# Patient Record
Sex: Female | Born: 2000 | Race: Black or African American | Hispanic: No | Marital: Single | Smoking: Never smoker
Health system: Southern US, Community
[De-identification: ages and names within clinical notes are randomized; demographics above are authoritative.]

## PROBLEM LIST (undated history)

## (undated) DIAGNOSIS — J45909 Unspecified asthma, uncomplicated: Secondary | ICD-10-CM

---
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
## 2021-03-23 ENCOUNTER — Emergency Department (HOSPITAL_COMMUNITY)
Admission: EM | Admit: 2021-03-23 | Discharge: 2021-03-23 | Disposition: A | Discharge status: Home / Self Care | Payer: Self-pay | Attending: Emergency Medicine | Admitting: Emergency Medicine

## 2021-03-23 ENCOUNTER — Other Ambulatory Visit: Payer: Self-pay

## 2021-03-23 ENCOUNTER — Emergency Department (HOSPITAL_COMMUNITY): Payer: Self-pay

## 2021-03-23 DIAGNOSIS — S99921A Unspecified injury of right foot, initial encounter: Secondary | ICD-10-CM | POA: Insufficient documentation

## 2021-03-23 DIAGNOSIS — X58XXXA Exposure to other specified factors, initial encounter: Secondary | ICD-10-CM | POA: Insufficient documentation

## 2021-03-23 NOTE — ED Triage Notes (Signed)
Stated potentially injured right toe a few days ago while she was having a panic attack

## 2021-03-23 NOTE — ED Notes (Signed)
DC instructions reviewed with pt. PT verbalized understanding. PT DC °

## 2021-03-23 NOTE — Discharge Instructions (Signed)
X-ray is normal. Tylenol or motrin for pain. Can follow-up with your primary care doctor. Return for new concerns.

## 2021-03-23 NOTE — ED Provider Notes (Signed)
[facility] Provider Note   CSN: [id number] Arrival date & time: 03/23/21  1818     History Chief Complaint  Patient presents with  . Toe Pain    Deborah Holland is a 20 y.o. female.  The history is provided by the patient and medical records.  Toe Pain    19 y.o. F here with right great toe pain.  States she injured her toe last week while she was having a panic attack.  Initially had swelling but did soak in epsom salts with relief of that.  Denies any pain related to the nail itself.  No fever/chills.  She is ambulatory.  Of note, patient generally uncooperative during history and physical as she is on phone with friend and refusing to hang up.  No past medical history on file.  There are no problems to display for this patient.   No past surgical history on file.   OB History   No obstetric history on file.     No family history on file.  Social History   Tobacco Use  . Smoking status: Never Smoker  . Smokeless tobacco: Never Used  Substance Use Topics  . Alcohol use: No    Home Medications Prior to Admission medications   Medication Sig Start Date End Date Taking? Authorizing Provider  amoxicillin (AMOXIL) 875 MG tablet Take 1 tablet (875 mg total) by mouth 2 (two) times daily. 07/09/15   Viviano Simas, NP  benzonatate (TESSALON) 100 MG capsule Take 1 capsule (100 mg total) by mouth every 8 (eight) hours. 09/23/16   Sam, Ace Gins, PA-C  fluticasone (FLONASE) 50 MCG/ACT nasal spray Place 2 sprays into both nostrils daily. 09/23/16   Sam, Ace Gins, PA-C  ibuprofen (ADVIL,MOTRIN) 600 MG tablet Take 1 tablet (600 mg total) by mouth every 6 (six) hours as needed. 09/23/16   Sam, Ace Gins, PA-C  methocarbamol (ROBAXIN) 500 MG tablet Take 1 tablet (500 mg total) by mouth 2 (two) times daily. 11/15/17   Mabe, Latanya Maudlin, MD  naproxen (NAPROSYN) 500 MG tablet Take 1 tablet (500 mg total) by mouth 2 (two) times daily. 11/15/17    Mabe, Latanya Maudlin, MD    Allergies    Other  Review of Systems   Review of Systems  Musculoskeletal: Positive for arthralgias.  All other systems reviewed and are negative.   Physical Exam Updated Vital Signs BP 127/79 (BP Location: Right Arm)   Pulse (!) 56   Temp 98.6 F (37 C) (Oral)   Resp 12   SpO2 100%   Physical Exam Vitals and nursing note reviewed.  Constitutional:      Appearance: She is well-developed.  HENT:     Head: Normocephalic and atraumatic.  Eyes:     Conjunctiva/sclera: Conjunctivae normal.     Pupils: Pupils are equal, round, and reactive to light.  Cardiovascular:     Rate and Rhythm: Normal rate and regular rhythm.     Heart sounds: Normal heart sounds.  Pulmonary:     Effort: Pulmonary effort is normal.     Breath sounds: Normal breath sounds.  Abdominal:     General: Bowel sounds are normal.     Palpations: Abdomen is soft.  Musculoskeletal:        General: Normal range of motion.     Cervical back: Normal range of motion.       Feet:     Comments: Tenderness as depicted without deformity or swelling, nails  are overgrown but intact, no signs of infection present, ambulatory  Skin:    General: Skin is warm and dry.  Neurological:     Mental Status: She is alert and oriented to person, place, and time.     ED Results / Procedures / Treatments   Labs (all labs ordered are listed, but only abnormal results are displayed) Labs Reviewed - No data to display  EKG None  Radiology DG Toe Great Right  Result Date: 03/23/2021 CLINICAL DATA:  Status post trauma. EXAM: RIGHT GREAT TOE COMPARISON:  None. FINDINGS: There is no evidence of fracture or dislocation. There is no evidence of arthropathy or other focal bone abnormality. Soft tissues are unremarkable. IMPRESSION: Negative. Electronically Signed   By: Aram Candela M.D.   On: 03/23/2021 22:46    Procedures Procedures   Medications Ordered in ED Medications - No data to  display  ED Course  I have reviewed the triage vital signs and the nursing notes.  Pertinent labs & imaging results that were available during my care of the patient were reviewed by me and considered in my medical decision making (see chart for details).    MDM Rules/Calculators/A&P  20 year old female presenting to the ED with right great toe pain, reportedly she injured it last week during a panic attack.  There is no deformity on exam.  No signs of infection.  X-ray is negative.  Discharged home with symptomatic care and PCP follow-up.  May return here for new concerns.  Final Clinical Impression(s) / ED Diagnoses Final diagnoses:  Injury of toe on right foot, initial encounter    Rx / DC Orders ED Discharge Orders    None       Deborah Hatchet, PA-C 03/23/21 2333    Deborah Laine, MD 03/25/21 [phone]

## 2021-03-23 NOTE — ED Triage Notes (Signed)
Emergency Medicine Provider Triage Evaluation Note  Deborah [facility] , Holland 20 y.o. female  was evaluated in triage.  Pt complains of right great toe pain.  She thinks she injured this Holland few nights ago when she is having Holland panic attack.  She has pain to her metacarpal, on her right great toe.  She feels like she has difficulty with flexion.  She states initially was swollen however soaking this and it has resolved.  She denies any pain to her surrounding nailbed. No Paresthesias, weakness.  She is able to ambulate.  No history of gout.  No fever, chills  Review of Systems  Positive: Right great toe pain Negative: Foot pain, redness, swelling, warmth  Physical Exam  BP 127/79 (BP Location: Right Arm)   Pulse (!) 56   Temp 98.6 F (37 C) (Oral)   Resp 12   SpO2 100%  Gen:   Awake, no distress   HEENT:  Atraumatic  Resp:  Normal effort  Cardiac:  Normal rate  Abd:   Nondistended, nontender  MSK:   Moves extremities without difficulty.  Diffuse tenderness to right great toe from metacarpal >>distally.  She does have very long toenail however no evidence of ingrown toenail.  No surrounding erythema or warmth Neuro:  Speech clear   Medical Decision Making  Medically screening exam initiated at 6:53 PM.  Appropriate orders placed.  Deborah Holland was informed that the remainder of the evaluation will be completed by another provider, this initial triage assessment does not replace that evaluation, and the importance of remaining in the ED until their evaluation is complete.  Clinical Impression  Right great toe pain   Deborah Co A, PA-C 03/23/21 1855

## 2021-09-07 ENCOUNTER — Other Ambulatory Visit (HOSPITAL_COMMUNITY): Payer: Self-pay

## 2022-03-11 ENCOUNTER — Encounter (HOSPITAL_COMMUNITY): Payer: Self-pay | Admitting: Emergency Medicine

## 2022-03-11 ENCOUNTER — Emergency Department (HOSPITAL_COMMUNITY)
Admission: EM | Admit: 2022-03-11 | Discharge: 2022-03-11 | Disposition: A | Discharge status: Home / Self Care | Payer: Self-pay | Attending: Emergency Medicine | Admitting: Emergency Medicine

## 2022-03-11 DIAGNOSIS — R42 Dizziness and giddiness: Secondary | ICD-10-CM | POA: Insufficient documentation

## 2022-03-11 DIAGNOSIS — F1290 Cannabis use, unspecified, uncomplicated: Secondary | ICD-10-CM | POA: Insufficient documentation

## 2022-03-11 NOTE — ED Provider Notes (Signed)
?[facility]-EMERGENCY DEPT ?Provider Note ? ? ?CSN: [id number] ?Arrival date & time: 03/11/22  1735 ? ?  ? ?History ? ?Chief Complaint  ?Patient presents with  ? Dizziness  ? ? ?Deborah Holland is a 21 y.o. female. ? ?21 year old female presents with dizziness times several days.  Patient does use copious amounts of marijuana daily.  Denies any associated chest pain or chest pressure.  Her dizziness is similar to her chronic anxiety.  She has been prescribed medication for this but she has been noncompliant.  Denies any SI or HI.  No fever or chills.  No associated headache.  No treatment use prior to arrival ? ? ?  ? ?Home Medications ?Prior to Admission medications   ?Medication Sig Start Date End Date Taking? Authorizing Provider  ?amoxicillin (AMOXIL) 875 MG tablet Take 1 tablet (875 mg total) by mouth 2 (two) times daily. 07/09/15   Viviano Simas, NP  ?benzonatate (TESSALON) 100 MG capsule Take 1 capsule (100 mg total) by mouth every 8 (eight) hours. 09/23/16   Eliseo Squires, PA-C  ?fluticasone (FLONASE) 50 MCG/ACT nasal spray Place 2 sprays into both nostrils daily. 09/23/16   Eliseo Squires, PA-C  ?ibuprofen (ADVIL,MOTRIN) 600 MG tablet Take 1 tablet (600 mg total) by mouth every 6 (six) hours as needed. 09/23/16   Eliseo Squires, PA-C  ?methocarbamol (ROBAXIN) 500 MG tablet Take 1 tablet (500 mg total) by mouth 2 (two) times daily. 11/15/17   Mabe, Latanya Maudlin, MD  ?naproxen (NAPROSYN) 500 MG tablet Take 1 tablet (500 mg total) by mouth 2 (two) times daily. 11/15/17   MabeLatanya Maudlin, MD  ?   ? ?Allergies    ?Other   ? ?Review of Systems   ?Review of Systems  ?All other systems reviewed and are negative. ? ?Physical Exam ?Updated Vital Signs ?BP 127/84 (BP Location: Left Arm)   Pulse 66   Temp 98.7 ?F (37.1 ?C) (Oral)   Resp 18   LMP 02/10/2022   SpO2 100%  ?Physical Exam ?Vitals and nursing note reviewed.  ?Constitutional:   ?   General: She is not in acute distress. ?    Appearance: Normal appearance. She is well-developed. She is not toxic-appearing.  ?HENT:  ?   Head: Normocephalic and atraumatic.  ?Eyes:  ?   General: Lids are normal.  ?   Conjunctiva/sclera: Conjunctivae normal.  ?   Pupils: Pupils are equal, round, and reactive to light.  ?Neck:  ?   Thyroid: No thyroid mass.  ?   Trachea: No tracheal deviation.  ?Cardiovascular:  ?   Rate and Rhythm: Normal rate and regular rhythm.  ?   Heart sounds: Normal heart sounds. No murmur heard. ?  No gallop.  ?Pulmonary:  ?   Effort: Pulmonary effort is normal. No respiratory distress.  ?   Breath sounds: Normal breath sounds. No stridor. No decreased breath sounds, wheezing, rhonchi or rales.  ?Abdominal:  ?   General: There is no distension.  ?   Palpations: Abdomen is soft.  ?   Tenderness: There is no abdominal tenderness. There is no rebound.  ?Musculoskeletal:     ?   General: No tenderness. Normal range of motion.  ?   Cervical back: Normal range of motion and neck supple.  ?Skin: ?   General: Skin is warm and dry.  ?   Findings: No abrasion or rash.  ?Neurological:  ?   Mental Status: She is alert and oriented  to person, place, and time. Mental status is at baseline.  ?   GCS: GCS eye subscore is 4. GCS verbal subscore is 5. GCS motor subscore is 6.  ?   Cranial Nerves: No cranial nerve deficit.  ?   Sensory: No sensory deficit.  ?   Motor: Motor function is intact.  ?Psychiatric:     ?   Attention and Perception: Attention normal.     ?   Speech: Speech normal.     ?   Behavior: Behavior normal.  ? ? ?ED Results / Procedures / Treatments   ?Labs ?(all labs ordered are listed, but only abnormal results are displayed) ?Labs Reviewed - No data to display ? ?EKG ?EKG Interpretation ? ?Date/Time:  Thursday March 11 2022 18:14:21 EDT ?Ventricular Rate:  61 ?PR Interval:  149 ?QRS Duration: 84 ?QT Interval:  415 ?QTC Calculation: 418 ?R Axis:   73 ?Text Interpretation: Sinus rhythm Confirmed by Lorre Nick ([id number]) on 03/11/2022  6:35:48 PM ? ?Radiology ?No results found. ? ?Procedures ?Procedures  ? ? ?Medications Ordered in ED ?Medications - No data to display ? ?ED Course/ Medical Decision Making/ A&P ?  ?                        ?Medical Decision Making ?Amount and/or Complexity of Data Reviewed ?ECG/medicine tests: ordered. ? ? ?Patient well-appearing here.  EKG per interpretation shows no signs of acute ischemic.  Patient without focal neurological deficits.  Suspect that this could be from her use of marijuana.  Do not feel that she needs to have any blood work at this time.  Case discussed with her mother in the room and patient will be discharged.   ? ? ? ? ? ?Final Clinical Impression(s) / ED Diagnoses ?Final diagnoses:  ?None  ? ? ?Rx / DC Orders ?ED Discharge Orders   ? ? None  ? ?  ? ? ?  ?Lorre Nick, MD ?03/11/22 1841 ? ?

## 2022-03-11 NOTE — ED Triage Notes (Signed)
Per pt, states she has been feeling dizzy and light headed for a couple of days-no new meds, no recent changes in diet ?

## 2022-04-09 ENCOUNTER — Emergency Department (HOSPITAL_COMMUNITY)
Admission: EM | Admit: 2022-04-09 | Discharge: 2022-04-09 | Disposition: A | Discharge status: Home / Self Care | Payer: Self-pay | Attending: Emergency Medicine | Admitting: Emergency Medicine

## 2022-04-09 ENCOUNTER — Other Ambulatory Visit: Payer: Self-pay

## 2022-04-09 ENCOUNTER — Encounter (HOSPITAL_COMMUNITY): Payer: Self-pay

## 2022-04-09 DIAGNOSIS — Z20822 Contact with and (suspected) exposure to covid-19: Secondary | ICD-10-CM | POA: Insufficient documentation

## 2022-04-09 DIAGNOSIS — J069 Acute upper respiratory infection, unspecified: Secondary | ICD-10-CM | POA: Insufficient documentation

## 2022-04-09 DIAGNOSIS — R42 Dizziness and giddiness: Secondary | ICD-10-CM | POA: Insufficient documentation

## 2022-04-09 LAB — RESP PANEL BY RT-PCR (FLU A&B, COVID) ARPGX2
Influenza A by PCR: NEGATIVE
Influenza B by PCR: NEGATIVE
SARS Coronavirus 2 by RT PCR: NEGATIVE

## 2022-04-09 MED ORDER — BENZONATATE 100 MG PO CAPS: 100.0000 mg | ORAL_CAPSULE | Freq: Three times a day (TID) | ORAL | 0 refills | Status: DC

## 2022-04-09 MED ORDER — ALBUTEROL SULFATE HFA 108 (90 BASE) MCG/ACT IN AERS: 1.0000 | INHALATION_SPRAY | Freq: Four times a day (QID) | RESPIRATORY_TRACT | 0 refills | Status: DC | PRN

## 2022-04-09 NOTE — ED Provider Notes (Signed)
?[facility]-EMERGENCY DEPT ?Provider Note ? ? ?CSN: [id number] ?Arrival date & time: 04/09/22  1701 ? ?  ? ?History ? ?Chief Complaint  ?Patient presents with  ? Shortness of Breath  ? Dizziness  ? Nasal Congestion  ? Cough  ? ? ?Deborah Holland is a 21 y.o. female. ? ?Patient with no pertinent past medical history presents today with complaints of cough and congestion. She states that same has been ongoing for the past 5 days. She states that her roommate has been sick with similar symptoms. She states that she has been trying Thera-flu and tylenol/ibuprofen for management of her symptoms but she is still having a non-productive cough that is not clearing up. She states that she has not been eating or drinking as well since onset of her symptoms due to feeling poorly. She denies any fevers, chills, nausea or vomiting. Does endorse slight headache and dizziness that has been intermittent today. ? ?The history is provided by the patient. No language interpreter was used.  ?Shortness of Breath ?Associated symptoms: cough   ?Associated symptoms: no fever and no sore throat   ?Dizziness ?Cough ?Associated symptoms: no chills, no fever and no sore throat   ? ?  ? ?Home Medications ?Prior to Admission medications   ?Medication Sig Start Date End Date Taking? Authorizing Provider  ?amoxicillin (AMOXIL) 875 MG tablet Take 1 tablet (875 mg total) by mouth 2 (two) times daily. 07/09/15   Viviano Simas, NP  ?benzonatate (TESSALON) 100 MG capsule Take 1 capsule (100 mg total) by mouth every 8 (eight) hours. 09/23/16   Eliseo Squires, PA-C  ?fluticasone (FLONASE) 50 MCG/ACT nasal spray Place 2 sprays into both nostrils daily. 09/23/16   Eliseo Squires, PA-C  ?ibuprofen (ADVIL,MOTRIN) 600 MG tablet Take 1 tablet (600 mg total) by mouth every 6 (six) hours as needed. 09/23/16   Eliseo Squires, PA-C  ?methocarbamol (ROBAXIN) 500 MG tablet Take 1 tablet (500 mg total) by mouth 2 (two) times daily.  11/15/17   Mabe, Latanya Maudlin, MD  ?naproxen (NAPROSYN) 500 MG tablet Take 1 tablet (500 mg total) by mouth 2 (two) times daily. 11/15/17   MabeLatanya Maudlin, MD  ?   ? ?Allergies    ?Other   ? ?Review of Systems   ?Review of Systems  ?Constitutional:  Negative for chills and fever.  ?HENT:  Positive for congestion. Negative for sore throat, trouble swallowing and voice change.   ?Respiratory:  Positive for cough.   ?Neurological:  Positive for dizziness.  ?All other systems reviewed and are negative. ? ?Physical Exam ?Updated Vital Signs ?BP 118/60 (BP Location: Left Arm)   Pulse 87   Temp 98.4 ?F (36.9 ?C) (Oral)   Resp 16   Ht 5\' 3"  (1.6 m)   Wt 51.3 kg   LMP 03/13/2022   SpO2 98%   BMI 20.05 kg/m?  ?Physical Exam ?Vitals and nursing note reviewed.  ?Constitutional:   ?   General: She is not in acute distress. ?   Appearance: Normal appearance. She is well-developed and normal weight. She is not ill-appearing, toxic-appearing or diaphoretic.  ?HENT:  ?   Head: Normocephalic and atraumatic.  ?Eyes:  ?   Extraocular Movements: Extraocular movements intact.  ?   Pupils: Pupils are equal, round, and reactive to light.  ?Cardiovascular:  ?   Rate and Rhythm: Normal rate and regular rhythm.  ?   Heart sounds: Normal heart sounds.  ?Pulmonary:  ?  Effort: Pulmonary effort is normal. No respiratory distress.  ?   Breath sounds: Normal breath sounds.  ?Abdominal:  ?   Palpations: Abdomen is soft.  ?Musculoskeletal:     ?   General: Normal range of motion.  ?   Cervical back: Normal range of motion and neck supple.  ?   Right lower leg: No tenderness. No edema.  ?   Left lower leg: No tenderness. No edema.  ?Lymphadenopathy:  ?   Cervical: No cervical adenopathy.  ?Skin: ?   General: Skin is warm and dry.  ?   Capillary Refill: Capillary refill takes less than 2 seconds.  ?Neurological:  ?   General: No focal deficit present.  ?   Mental Status: She is alert.  ?   Cranial Nerves: No cranial nerve deficit.  ?   Motor: No  weakness.  ?Psychiatric:     ?   Mood and Affect: Mood normal.     ?   Behavior: Behavior normal.  ? ? ?ED Results / Procedures / Treatments   ?Labs ?(all labs ordered are listed, but only abnormal results are displayed) ?Labs Reviewed  ?RESP PANEL BY RT-PCR (FLU A&B, COVID) [id number]  ? ? ?EKG ?None ? ?Radiology ?No results found. ? ?Procedures ?Procedures  ? ? ?Medications Ordered in ED ?Medications - No data to display ? ?ED Course/ Medical Decision Making/ A&P ?  ?                        ?Medical Decision Making ? ?Patient presents today with complaint of cough and congestion. She is afebrile, non-toxic appearing, and in no acute distress with reassuring vital signs. She is also alert and oriented and neurologically intact. Lung sounds present and clear to auscultation in all fields, no imaging indicated at this time. Patients symptoms are consistent with URI, likely viral etiology. Suspect that patients headache and dizziness is related to her URI, discussed with patient who is in agreement. Emphasized the importance of maintaining oral hydration. Discussed that antibiotics are not indicated for viral infections. Pt will be discharged with symptomatic treatment including Tessalon.  Verbalizes understanding and is agreeable with plan. Pt is hemodynamically stable & in NAD prior to dc. Discharged in stable condition. ? ? ?Final Clinical Impression(s) / ED Diagnoses ?Final diagnoses:  ?Viral URI with cough  ? ? ?Rx / DC Orders ?ED Discharge Orders   ? ?      Ordered  ?  albuterol (VENTOLIN HFA) 108 (90 Base) MCG/ACT inhaler  Every 6 hours PRN       ? 04/09/22 1919  ?  benzonatate (TESSALON) 100 MG capsule  Every 8 hours       ? 04/09/22 1919  ? ?  ?  ? ?  ?An After Visit Summary was printed and given to the patient. ? ? ?  ?Bud Face, PA-C ?04/09/22 1922 ? ?  ?Fredia Sorrow, MD ?04/13/22 [phone] ? ?

## 2022-04-09 NOTE — Discharge Instructions (Signed)
As we discussed, your work-up in the ER was reassuring for acute abnormalities.  I suspect that your symptoms are related to an upper respiratory infection.  As these are almost always viral in nature, no antibiotics are indicated.  Please manage at home with the prescribed medications that I have given for you.  You may also take Tylenol/ibuprofen as needed for additional pain and fever control.  ? ?Return if development of any new or worsening symptoms. ?

## 2022-04-09 NOTE — ED Triage Notes (Signed)
Patient c/o nasal congestion, a non productive cough 5 days ago. ?Patient states today, she began having a headache and dizziness, and needing to "use a humidifier  to breathe." ?

## 2022-04-19 ENCOUNTER — Encounter (HOSPITAL_COMMUNITY): Payer: Self-pay | Admitting: Emergency Medicine

## 2022-04-19 ENCOUNTER — Other Ambulatory Visit: Payer: Self-pay

## 2022-04-19 ENCOUNTER — Emergency Department (HOSPITAL_COMMUNITY): Payer: Self-pay

## 2022-04-19 ENCOUNTER — Emergency Department (HOSPITAL_COMMUNITY)
Admission: EM | Admit: 2022-04-19 | Discharge: 2022-04-19 | Disposition: A | Discharge status: Home / Self Care | Payer: Self-pay | Attending: Emergency Medicine | Admitting: Emergency Medicine

## 2022-04-19 DIAGNOSIS — M25522 Pain in left elbow: Secondary | ICD-10-CM | POA: Insufficient documentation

## 2022-04-19 MED ORDER — NAPROXEN 500 MG PO TABS: 500.0000 mg | ORAL_TABLET | Freq: Two times a day (BID) | ORAL | 0 refills | Status: AC

## 2022-04-19 MED ORDER — NAPROXEN 500 MG PO TABS
500.0000 mg | ORAL_TABLET | Freq: Once | ORAL | Status: AC
  Administered 2022-04-19: 500 mg via ORAL
  Filled 2022-04-19: qty 1

## 2022-04-19 NOTE — ED Notes (Signed)
I provided reinforced discharge education based off of discharge instructions. Pt acknowledged and understood my education. Pt had no further questions/concerns for provider/myself.  °

## 2022-04-19 NOTE — ED Provider Notes (Signed)
?[facility]-EMERGENCY DEPT ?Provider Note ? ? ?CSN: [id number] ?Arrival date & time: 04/19/22  1738 ? ?  ? ?History ? ?Chief Complaint  ?Patient presents with  ? Arm Pain  ? ? ?Deborah Holland is a 21 y.o. female with complaint of left elbow pain over the last 3 days.  Unknown cause.  Denies known injury, but states she is "clumsy" and could have bumped it and not remembered doing so.  Denies numbness and tingling of the extremity.  Denies fever.  Has never had this happen before.  Recently finished recovering from an upper respiratory infection, for which she took an antibiotic that she was not prescribed.  States the antibiotics were "red pills" and left over from a family member's ear infection.  Appears to be favoring the left elbow, stating it feels a bit stiff. ? ?Accompanied by mother. ? ?The history is provided by the patient and medical records.  ?Arm Pain ? ? ?  ? ?Home Medications ?Prior to Admission medications   ?Medication Sig Start Date End Date Taking? Authorizing Provider  ?naproxen (NAPROSYN) 500 MG tablet Take 1 tablet (500 mg total) by mouth 2 (two) times daily for 7 days. 04/19/22 04/26/22 Yes Cecil Cobbs, PA-C  ?albuterol (VENTOLIN HFA) 108 (90 Base) MCG/ACT inhaler Inhale 1-2 puffs into the lungs every 6 (six) hours as needed for wheezing or shortness of breath. 04/09/22   Smoot, Sarah A, PA-C  ?amoxicillin (AMOXIL) 875 MG tablet Take 1 tablet (875 mg total) by mouth 2 (two) times daily. 07/09/15   Viviano Simas, NP  ?benzonatate (TESSALON) 100 MG capsule Take 1 capsule (100 mg total) by mouth every 8 (eight) hours. 04/09/22   Smoot, Shawn Route, PA-C  ?fluticasone (FLONASE) 50 MCG/ACT nasal spray Place 2 sprays into both nostrils daily. 09/23/16   Eliseo Squires, PA-C  ?ibuprofen (ADVIL,MOTRIN) 600 MG tablet Take 1 tablet (600 mg total) by mouth every 6 (six) hours as needed. 09/23/16   Eliseo Squires, PA-C  ?methocarbamol (ROBAXIN) 500 MG tablet Take 1 tablet  (500 mg total) by mouth 2 (two) times daily. 11/15/17   MabeLatanya Maudlin, MD  ?   ? ?Allergies    ?Other   ? ?Review of Systems   ?Review of Systems  ?Musculoskeletal:   ?     Left elbow pain  ? ?Physical Exam ?Updated Vital Signs ?BP 108/66 (BP Location: Right Arm)   Pulse 64   Temp 98.4 ?F (36.9 ?C) (Oral)   Resp 18   LMP 04/12/2022   SpO2 100%  ?Physical Exam ?Vitals and nursing note reviewed.  ?Constitutional:   ?   General: She is not in acute distress. ?   Appearance: She is well-developed. She is not ill-appearing or diaphoretic.  ?HENT:  ?   Head: Normocephalic and atraumatic.  ?Eyes:  ?   General: No scleral icterus. ?   Conjunctiva/sclera: Conjunctivae normal.  ?Cardiovascular:  ?   Rate and Rhythm: Normal rate and regular rhythm.  ?   Pulses: Normal pulses.  ?   Heart sounds: No murmur heard. ?Pulmonary:  ?   Effort: Pulmonary effort is normal. No respiratory distress.  ?   Breath sounds: Normal breath sounds.  ?Abdominal:  ?   Palpations: Abdomen is soft.  ?   Tenderness: There is no abdominal tenderness.  ?Musculoskeletal:     ?   General: No swelling.  ?   Right elbow: Normal.  ?     Arms: ? ?  Cervical back: Neck supple. No rigidity or tenderness.  ?   Comments: Tenderness and mild erythema as depicted above, without obvious deformity, swelling, laceration, rash, wound.  Able to fully flex and extend 0-180 degrees with mild encouragement.  Supination and pronation induced mild tenderness.  Range of the shoulder joint preserved.  Without shoulder joint or wrist joint tenderness or impingement.  Radial pulses 2+ bilaterally.  Afebrile.  ?Skin: ?   General: Skin is warm and dry.  ?   Capillary Refill: Capillary refill takes less than 2 seconds.  ?   Coloration: Skin is not jaundiced or pale.  ?Neurological:  ?   Mental Status: She is alert and oriented to person, place, and time.  ?Psychiatric:     ?   Mood and Affect: Mood normal.  ? ? ?ED Results / Procedures / Treatments   ?Labs ?(all labs ordered  are listed, but only abnormal results are displayed) ?Labs Reviewed - No data to display ? ?EKG ?None ? ?Radiology ?DG Elbow Complete Left ? ?Result Date: 04/19/2022 ?CLINICAL DATA:  Left elbow pain EXAM: LEFT ELBOW - COMPLETE 3+ VIEW COMPARISON:  None Available. FINDINGS: There is no evidence of fracture, dislocation, or joint effusion. There is no evidence of arthropathy or other focal bone abnormality. Soft tissues are unremarkable. IMPRESSION: Negative. Electronically Signed   By: Helyn Numbers M.D.   On: 04/19/2022 19:16   ? ?Procedures ?Procedures  ? ? ?Medications Ordered in ED ?Medications  ?naproxen (NAPROSYN) tablet 500 mg (500 mg Oral Given 04/19/22 1931)  ? ? ?ED Course/ Medical Decision Making/ A&P ?  ?                        ?Medical Decision Making ?Amount and/or Complexity of Data Reviewed ?External Data Reviewed: notes. ?Labs: ordered. Decision-making details documented in ED Course. ?Radiology: ordered and independent interpretation performed. Decision-making details documented in ED Course. ?ECG/medicine tests: ordered and independent interpretation performed. Decision-making details documented in ED Course. ? ?Risk ?OTC drugs. ?Prescription drug management. ? ? ?21 y.o. female presents to the ED for concern of Left Elbow Pain ?  ?This involves an extensive number of treatment options, and is a complaint that carries with it a high risk of complications and morbidity.  The emergent differential diagnosis prior to evaluation includes, but is not limited to: Fracture, dislocation, effusion, gout, septic arthritis ? ?This is not an exhaustive differential.  ? ?Past Medical History / Co-morbidities / Social History: ?Recent upper respiratory infection. ? ?Additional History:  ?Internal and external records from outside source obtained and reviewed including ED visits ? ?Physical Exam: ?Physical exam performed. The pertinent findings include: Tenderness and mild erythema of the left elbow, specifically to  the lateral epicondyles.  Shoulder ROM intact. ? ?Lab Tests: ?None ? ?Imaging Studies: ?I ordered imaging studies including x-ray left elbow.  I independently visualized and interpreted said imaging.  Pertinent results include: ?XR left elbow: Negative for fracture, dislocation, or joint effusion ?I agree with the radiologist interpretation. ? ?Medications: ?I ordered medication including naproxen for pain relief.  Reevaluation of the patient after these medicines showed that the patient tolerated this well and showed mild-moderate improvement.  I have reviewed the patients home medicines and have made adjustments as needed ? ?ED Course/Disposition: ?Pt mildly anxious-appearing on exam.  Mild tenderness and favoring of the left arm/elbow.  Left shoulder joint appears unaffected.  No laceration, edema, warmth, erythema, or wound.  Appears to be  a likely mild bruise appreciated on exam of the area of complaint.  Patient is afebrile.  Able to move the elbow joint with almost full range of motion, but appears to be anxious in doing so.  Not suspicious of ischemia, septic arthritis, gout, or reactive arthritis at this time.   Patient X-Ray negative for obvious fracture or dislocation or joint effusion.  Pain managed in ED.  Pt advised to follow up with orthopedics if symptoms persist for possibility of missed fracture diagnosis.  Patient encouraged to stretch and practice range of motion.  Conservative therapy recommended and discussed.  Pt is stable and in NAD.  Plan for discharge home. ? ?After consideration of the diagnostic results and the patient's encounter today, I feel that the emergency department workup does not suggest an emergent condition requiring admission or immediate intervention beyond what has been performed at this time.  The patient is safe for discharge and has been instructed to return immediately for worsening symptoms, change in symptoms or any other concerns.  Discussed course of treatment  thoroughly with the patient and her mother, whom demonstrated understanding.  Patient in agreement and has no further questions. ? ?I discussed this case with my attending physician Dr. Silverio LayYao, who agreed with the

## 2022-04-19 NOTE — ED Triage Notes (Signed)
Patient is complains of limited mobility and pain in the left arm. Pain increases with touch and movement. Symptoms started 3 days ago. She is unsure of what may have cause this.  ?

## 2022-04-19 NOTE — Discharge Instructions (Addendum)
You have been provided the contact information for local orthopedic office.  Please contact and schedule an appointment for reevaluation and continued medical management. ? ?A prescription has been called into her pharmacy by the name of naproxen.  This is an anti-inflammatory which may help relieve pain.  Take 1 tablet every 12 hours as needed for the next week or so.  Always take with water and plenty of food. ? ?Return to the ED for new or worsening symptoms as discussed. ?

## 2022-06-02 ENCOUNTER — Emergency Department (HOSPITAL_COMMUNITY)
Admission: EM | Admit: 2022-06-02 | Discharge: 2022-06-02 | Disposition: A | Discharge status: Home / Self Care | Payer: No Typology Code available for payment source | Attending: Emergency Medicine | Admitting: Emergency Medicine

## 2022-06-02 ENCOUNTER — Emergency Department (HOSPITAL_COMMUNITY): Payer: No Typology Code available for payment source

## 2022-06-02 ENCOUNTER — Encounter (HOSPITAL_COMMUNITY): Payer: Self-pay

## 2022-06-02 DIAGNOSIS — Y92410 Unspecified street and highway as the place of occurrence of the external cause: Secondary | ICD-10-CM | POA: Diagnosis not present

## 2022-06-02 DIAGNOSIS — S161XXA Strain of muscle, fascia and tendon at neck level, initial encounter: Secondary | ICD-10-CM | POA: Diagnosis not present

## 2022-06-02 DIAGNOSIS — S060X0A Concussion without loss of consciousness, initial encounter: Secondary | ICD-10-CM | POA: Insufficient documentation

## 2022-06-02 DIAGNOSIS — M545 Low back pain, unspecified: Secondary | ICD-10-CM | POA: Insufficient documentation

## 2022-06-02 DIAGNOSIS — S0990XA Unspecified injury of head, initial encounter: Secondary | ICD-10-CM | POA: Diagnosis present

## 2022-06-02 MED ORDER — OXYCODONE-ACETAMINOPHEN 5-325 MG PO TABS
1.0000 | ORAL_TABLET | Freq: Once | ORAL | Status: AC
  Administered 2022-06-02: 1 via ORAL
  Filled 2022-06-02: qty 1

## 2022-06-02 MED ORDER — ACETAMINOPHEN 325 MG PO TABS: 650.0000 mg | ORAL_TABLET | Freq: Four times a day (QID) | ORAL | 0 refills | Status: DC | PRN

## 2022-06-02 MED ORDER — IBUPROFEN 600 MG PO TABS: 600.0000 mg | ORAL_TABLET | Freq: Four times a day (QID) | ORAL | 0 refills | Status: DC | PRN

## 2022-06-02 MED ORDER — IBUPROFEN 200 MG PO TABS
600.0000 mg | ORAL_TABLET | Freq: Once | ORAL | Status: AC
  Administered 2022-06-02: 600 mg via ORAL
  Filled 2022-06-02: qty 3

## 2022-06-02 MED ORDER — CYCLOBENZAPRINE HCL 10 MG PO TABS: 10.0000 mg | ORAL_TABLET | Freq: Two times a day (BID) | ORAL | 0 refills | Status: DC | PRN
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
## 2022-06-02 NOTE — ED Notes (Signed)
I provided reinforced discharge education based off of discharge instructions. Pt acknowledged and understood my education. Pt had no further questions/concerns for provider/myself.  °

## 2022-06-25 ENCOUNTER — Emergency Department (HOSPITAL_COMMUNITY): Payer: No Typology Code available for payment source

## 2022-06-25 ENCOUNTER — Emergency Department (HOSPITAL_COMMUNITY)
Admission: EM | Admit: 2022-06-25 | Discharge: 2022-06-25 | Disposition: A | Discharge status: Home / Self Care | Payer: No Typology Code available for payment source | Attending: Emergency Medicine | Admitting: Emergency Medicine

## 2022-06-25 ENCOUNTER — Encounter (HOSPITAL_COMMUNITY): Payer: Self-pay | Admitting: Emergency Medicine

## 2022-06-25 DIAGNOSIS — S39012A Strain of muscle, fascia and tendon of lower back, initial encounter: Secondary | ICD-10-CM | POA: Insufficient documentation

## 2022-06-25 DIAGNOSIS — S3992XA Unspecified injury of lower back, initial encounter: Secondary | ICD-10-CM | POA: Diagnosis present

## 2022-06-25 DIAGNOSIS — Y92410 Unspecified street and highway as the place of occurrence of the external cause: Secondary | ICD-10-CM | POA: Diagnosis not present

## 2022-06-25 DIAGNOSIS — R519 Headache, unspecified: Secondary | ICD-10-CM | POA: Diagnosis not present

## 2022-06-25 DIAGNOSIS — S39012D Strain of muscle, fascia and tendon of lower back, subsequent encounter: Secondary | ICD-10-CM

## 2022-06-25 LAB — POC URINE PREG, ED: Preg Test, Ur: NEGATIVE

## 2022-06-25 MED ORDER — NAPROXEN 500 MG PO TABS: 500.0000 mg | ORAL_TABLET | Freq: Two times a day (BID) | ORAL | 0 refills | Status: DC | PRN

## 2022-06-25 MED ORDER — IBUPROFEN 800 MG PO TABS
800.0000 mg | ORAL_TABLET | Freq: Once | ORAL | Status: AC
Start: 2022-06-25 — End: 2022-06-25
  Administered 2022-06-25: 800 mg via ORAL
  Filled 2022-06-25: qty 1

## 2022-06-25 MED ORDER — METHOCARBAMOL 500 MG PO TABS
500.0000 mg | ORAL_TABLET | Freq: Once | ORAL | Status: AC
  Administered 2022-06-25: 500 mg via ORAL
  Filled 2022-06-25: qty 1

## 2022-06-25 MED ORDER — METHOCARBAMOL 500 MG PO TABS
500.0000 mg | ORAL_TABLET | Freq: Three times a day (TID) | ORAL | 0 refills | Status: DC | PRN
Start: 2022-06-25 — End: 2022-08-22
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
## 2022-06-25 NOTE — Discharge Instructions (Signed)
Take the antiinflammatories as prescribed. Followup with your doctor. Return to the ED if you develop new or worsening symptoms including weakness, numbness, tingling, incontinence, or any other concerns.

## 2022-08-17 ENCOUNTER — Encounter (HOSPITAL_COMMUNITY): Payer: Self-pay

## 2022-08-17 ENCOUNTER — Emergency Department (HOSPITAL_COMMUNITY)
Admission: EM | Admit: 2022-08-17 | Discharge: 2022-08-18 | Disposition: A | Discharge status: Home / Self Care | Payer: Self-pay | Attending: Emergency Medicine | Admitting: Emergency Medicine

## 2022-08-17 DIAGNOSIS — N899 Noninflammatory disorder of vagina, unspecified: Secondary | ICD-10-CM | POA: Insufficient documentation

## 2022-08-17 DIAGNOSIS — E876 Hypokalemia: Secondary | ICD-10-CM | POA: Insufficient documentation

## 2022-08-17 DIAGNOSIS — R824 Acetonuria: Secondary | ICD-10-CM | POA: Insufficient documentation

## 2022-08-17 DIAGNOSIS — D72829 Elevated white blood cell count, unspecified: Secondary | ICD-10-CM | POA: Insufficient documentation

## 2022-08-17 DIAGNOSIS — R1031 Right lower quadrant pain: Secondary | ICD-10-CM | POA: Insufficient documentation

## 2022-08-17 LAB — COMPREHENSIVE METABOLIC PANEL
ALT: 14 U/L (ref 0–44)
AST: 16 U/L (ref 15–41)
Albumin: 4.3 g/dL (ref 3.5–5.0)
Alkaline Phosphatase: 56 U/L (ref 38–126)
Anion gap: 7 (ref 5–15)
BUN: 10 mg/dL (ref 6–20)
CO2: 22 mmol/L (ref 22–32)
Calcium: 9.4 mg/dL (ref 8.9–10.3)
Chloride: 108 mmol/L (ref 98–111)
Creatinine, Ser: 0.71 mg/dL (ref 0.44–1.00)
GFR, Estimated: >60 mL/min (ref >60)
Glucose, Bld: 94 mg/dL (ref 70–99)
Potassium: 3.1 mmol/L — ABNORMAL LOW (ref 3.5–5.1)
Sodium: 137 mmol/L (ref 135–145)
Total Bilirubin: 0.6 mg/dL (ref 0.3–1.2)
Total Protein: 7.7 g/dL (ref 6.5–8.1)

## 2022-08-17 LAB — CBC WITH DIFFERENTIAL/PLATELET
Abs Immature Granulocytes: 0.04 10*3/uL (ref 0.00–0.07)
Basophils Absolute: 0.1 10*3/uL (ref 0.0–0.1)
Basophils Relative: 1 %
Eosinophils Absolute: 0 10*3/uL (ref 0.0–0.5)
Eosinophils Relative: 0 %
HCT: 39.9 % (ref 36.0–46.0)
Hemoglobin: 13.1 g/dL (ref 12.0–15.0)
Immature Granulocytes: 0 %
Lymphocytes Relative: 22 %
Lymphs Abs: 3 10*3/uL (ref 0.7–4.0)
MCH: 28.7 pg (ref 26.0–34.0)
MCHC: 32.8 g/dL (ref 30.0–36.0)
MCV: 87.5 fL (ref 80.0–100.0)
Monocytes Absolute: 0.9 10*3/uL (ref 0.1–1.0)
Monocytes Relative: 7 %
Neutro Abs: 9.5 10*3/uL — ABNORMAL HIGH (ref 1.7–7.7)
Neutrophils Relative %: 70 %
Platelets: 212 10*3/uL (ref 150–400)
RBC: 4.56 MIL/uL (ref 3.87–5.11)
RDW: 13.1 % (ref 11.5–15.5)
WBC: 13.5 10*3/uL — ABNORMAL HIGH (ref 4.0–10.5)
nRBC: 0 % (ref 0.0–0.2)

## 2022-08-17 LAB — URINALYSIS, ROUTINE W REFLEX MICROSCOPIC
Bacteria, UA: NONE SEEN
Bilirubin Urine: NEGATIVE
Glucose, UA: NEGATIVE mg/dL
Hgb urine dipstick: NEGATIVE
Ketones, ur: 20 mg/dL — AB
Nitrite: NEGATIVE
Protein, ur: NEGATIVE mg/dL
Specific Gravity, Urine: 1.026 (ref 1.005–1.030)
pH: 5 (ref 5.0–8.0)

## 2022-08-17 LAB — LIPASE, BLOOD: Lipase: 24 U/L (ref 11–51)

## 2022-08-17 LAB — I-STAT BETA HCG BLOOD, ED (MC, WL, AP ONLY): I-stat hCG, quantitative: <5 m[IU]/mL (ref <5)

## 2022-08-17 NOTE — ED Triage Notes (Signed)
Pt presents with c/o right upper side abdominal pain that started last night. Pt denies any V/D.

## 2022-08-17 NOTE — ED Provider Triage Note (Signed)
Emergency Medicine Provider Triage Evaluation Note  Deborah Holland , a 21 y.o. female  was evaluated in triage.  Pt complains of RLQ abdominal pain associated with nausea and vaginal discharge. Unsure about possibility of STIs. Unsure whether or not she is pregnant.   Review of Systems  Positive: Abdominal pain.  Negative: CP  Physical Exam  BP 121/65 (BP Location: Left Arm)   Pulse 70   Temp 97.9 F (36.6 C) (Oral)   Resp 18   LMP 07/26/2022 (Approximate)   SpO2 100%  Gen:   Awake, no distress   Resp:  Normal effort  MSK:   Moves extremities without difficulty  Other:  TTP on right side of abdomen  Medical Decision Making  Medically screening exam initiated at 7:01 PM.  Appropriate orders placed.  Deborah Holland was informed that the remainder of the evaluation will be completed by another provider, this initial triage assessment does not replace that evaluation, and the importance of remaining in the ED until their evaluation is complete.  labs   Deborah Holland 08/17/22 1902

## 2022-08-17 NOTE — ED Notes (Signed)
Pt to scared to have blood work done. 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

## 2022-08-18 ENCOUNTER — Emergency Department (HOSPITAL_COMMUNITY): Payer: Self-pay

## 2022-08-18 ENCOUNTER — Encounter (HOSPITAL_COMMUNITY): Payer: Self-pay | Admitting: Emergency Medicine

## 2022-08-18 DIAGNOSIS — A749 Chlamydial infection, unspecified: Secondary | ICD-10-CM

## 2022-08-18 HISTORY — DX: Chlamydial infection, unspecified: A74.9

## 2022-08-18 LAB — WET PREP, GENITAL
Clue Cells Wet Prep HPF POC: NONE SEEN
Sperm: NONE SEEN
Trich, Wet Prep: NONE SEEN
WBC, Wet Prep HPF POC: <10 (ref <10)
Yeast Wet Prep HPF POC: NONE SEEN

## 2022-08-18 LAB — GC/CHLAMYDIA PROBE AMP (~~LOC~~) NOT AT ARMC
Chlamydia: POSITIVE — AB
Comment: NEGATIVE
Comment: NORMAL
Neisseria Gonorrhea: NEGATIVE

## 2022-08-18 MED ORDER — IOHEXOL 9 MG/ML PO SOLN: 1000.0000 mL | ORAL | Status: AC

## 2022-08-18 MED ORDER — KETOROLAC TROMETHAMINE 15 MG/ML IJ SOLN
15.0000 mg | Freq: Once | INTRAMUSCULAR | Status: AC
  Administered 2022-08-18: 15 mg via INTRAVENOUS
  Filled 2022-08-18: qty 1

## 2022-08-18 MED ORDER — IOHEXOL 300 MG/ML  SOLN
100.0000 mL | Freq: Once | INTRAMUSCULAR | Status: AC | PRN
Start: 2022-08-18 — End: 2022-08-18
  Administered 2022-08-18: 100 mL via INTRAVENOUS

## 2022-08-18 MED ORDER — OXYCODONE-ACETAMINOPHEN 5-325 MG PO TABS
1.0000 | ORAL_TABLET | Freq: Once | ORAL | Status: AC
  Administered 2022-08-18: 1 via ORAL
  Filled 2022-08-18: qty 1

## 2022-08-18 MED ORDER — SODIUM CHLORIDE (PF) 0.9 % IJ SOLN
INTRAMUSCULAR | Status: AC
  Filled 2022-08-18: qty 50

## 2022-08-18 MED ORDER — IOHEXOL 9 MG/ML PO SOLN
ORAL | Status: AC
  Filled 2022-08-18: qty 1000

## 2022-08-18 MED ORDER — LORAZEPAM 0.5 MG PO TABS
0.5000 mg | ORAL_TABLET | Freq: Once | ORAL | Status: AC
  Administered 2022-08-18: 0.5 mg via ORAL
  Filled 2022-08-18: qty 1

## 2022-08-18 NOTE — ED Notes (Signed)
Patient transported to CT 

## 2022-08-18 NOTE — Discharge Instructions (Signed)
You are seen here today for your belly pain.  You likely had a ruptured ovarian cyst on the right that caused a bit of fluid in your belly that may have caused some inflammation.  You do not have appendicitis and you do not have any signs of infection in your abdomen or pelvis at this time.  Her gonorrhea and Chlamydia tests are pending you may follow your test results in your MyChart account in the next 72 hours.  If anything is positive please return to the ER or urgent care for treatment.  You may take Tylenol ibuprofen as needed for your pain. return to the ER with any severe symptoms

## 2022-08-18 NOTE — ED Provider Notes (Signed)
[facility]-EMERGENCY DEPT Provider Note   CSN: [id number] Arrival date & time: 08/17/22  1834     History  Chief Complaint  Patient presents with   Abdominal Pain    Deborah Holland is a 21 y.o. female who presents with concern for right sided abdominal pain that woke her from her sleep yesterday.  Lower in her abdomen, radiates to the upper right quadrant as well.  Patient states she has had 1 week of vaginal discharge.  She is sexually active with female partners without using protection and is not currently on any contraception.  LMP approximately 3 weeks ago.  No history of STI to the patient's knowledge.  No vomiting or diarrhea, some nausea.  No fevers or chills.  Personally reviewed the patient's medical records.  She does not carry medical diagnoses nor she any medications daily.  She states that she was seen by gynecologist at one point but was unable to tolerate pelvic exam, states that she has PTSD in relation to these evaluations.  HPI     Home Medications Prior to Admission medications   Medication Sig Start Date End Date Taking? Authorizing Provider  acetaminophen (TYLENOL) 325 MG tablet Take 2 tablets (650 mg total) by mouth every 6 (six) hours as needed for up to 30 doses for moderate pain, fever or headache. 06/02/22   Renaye Rakers, Kermit Balo, MD  albuterol (VENTOLIN HFA) 108 (90 Base) MCG/ACT inhaler Inhale 1-2 puffs into the lungs every 6 (six) hours as needed for wheezing or shortness of breath. 04/09/22   Smoot, Shawn Route, PA-C  amoxicillin (AMOXIL) 875 MG tablet Take 1 tablet (875 mg total) by mouth 2 (two) times daily. 07/09/15   Viviano Simas, NP  benzonatate (TESSALON) 100 MG capsule Take 1 capsule (100 mg total) by mouth every 8 (eight) hours. 04/09/22   Smoot, Shawn Route, PA-C  cyclobenzaprine (FLEXERIL) 10 MG tablet Take 1 tablet (10 mg total) by mouth 2 (two) times daily as needed for up to 14 doses for muscle spasms. 06/02/22   Terald Sleeper,  MD  fluticasone (FLONASE) 50 MCG/ACT nasal spray Place 2 sprays into both nostrils daily. 09/23/16   Eliseo Squires, PA-C  ibuprofen (ADVIL) 600 MG tablet Take 1 tablet (600 mg total) by mouth every 6 (six) hours as needed for up to 30 doses for mild pain, moderate pain or headache. 06/02/22   Trifan, Kermit Balo, MD  ibuprofen (ADVIL,MOTRIN) 600 MG tablet Take 1 tablet (600 mg total) by mouth every 6 (six) hours as needed. 09/23/16   Eliseo Squires, PA-C  methocarbamol (ROBAXIN) 500 MG tablet Take 1 tablet (500 mg total) by mouth every 8 (eight) hours as needed for muscle spasms. 06/25/22   Rancour, Jeannett Senior, MD  naproxen (NAPROSYN) 500 MG tablet Take 1 tablet (500 mg total) by mouth 2 (two) times daily as needed. 06/25/22   Glynn Octave, MD      Allergies    Other    Review of Systems   Review of Systems  Constitutional:  Positive for appetite change. Negative for chills, fatigue and fever.  HENT: Negative.    Respiratory: Negative.    Cardiovascular: Negative.   Gastrointestinal:  Positive for abdominal pain and nausea. Negative for diarrhea and vomiting.  Genitourinary:  Positive for pelvic pain and vaginal discharge. Negative for dysuria, frequency, urgency, vaginal bleeding and vaginal pain.  Musculoskeletal: Negative.   Skin: Negative.   Hematological: Negative.   Psychiatric/Behavioral: Negative.  Physical Exam Updated Vital Signs BP (!) 111/59   Pulse 65   Temp 98.4 F (36.9 C)   Resp 15   LMP 07/26/2022 (Approximate) Comment: negative beta HCG 08/17/22  SpO2 100%  Physical Exam Vitals and nursing note reviewed. Exam conducted with a chaperone present Software engineer).  Constitutional:      Appearance: She is not ill-appearing or toxic-appearing.  HENT:     Head: Normocephalic and atraumatic.     Mouth/Throat:     Mouth: Mucous membranes are moist.     Pharynx: No oropharyngeal exudate or posterior oropharyngeal erythema.  Eyes:     General:        Right  eye: No discharge.        Left eye: No discharge.     Extraocular Movements: Extraocular movements intact.     Conjunctiva/sclera: Conjunctivae normal.     Pupils: Pupils are equal, round, and reactive to light.  Cardiovascular:     Rate and Rhythm: Normal rate and regular rhythm.     Pulses: Normal pulses.  Pulmonary:     Effort: Pulmonary effort is normal. No respiratory distress.     Breath sounds: Normal breath sounds. No wheezing or rales.  Abdominal:     General: Bowel sounds are normal. There is no distension.     Palpations: Abdomen is soft.     Tenderness: There is abdominal tenderness in the right lower quadrant. There is no right CVA tenderness, left CVA tenderness, guarding or rebound. Positive signs include McBurney's sign, psoas sign and obturator sign. Negative signs include Murphy's sign and Rovsing's sign.  Genitourinary:    General: Normal vulva.     Exam position: Supine.       Comments: Patient refused internal exam. Musculoskeletal:        General: No deformity.     Cervical back: Neck supple.  Skin:    General: Skin is warm and dry.     Capillary Refill: Capillary refill takes less than 2 seconds.  Neurological:     General: No focal deficit present.     Mental Status: She is alert and oriented to person, place, and time. Mental status is at baseline.  Psychiatric:        Mood and Affect: Mood normal.     ED Results / Procedures / Treatments   Labs (all labs ordered are listed, but only abnormal results are displayed) Labs Reviewed  CBC WITH DIFFERENTIAL/PLATELET - Abnormal; Notable for the following components:      Result Value   WBC 13.5 (*)    Neutro Abs 9.5 (*)    All other components within normal limits  COMPREHENSIVE METABOLIC PANEL - Abnormal; Notable for the following components:   Potassium 3.1 (*)    All other components within normal limits  URINALYSIS, ROUTINE W REFLEX MICROSCOPIC - Abnormal; Notable for the following components:    Ketones, ur 20 (*)    Leukocytes,Ua LARGE (*)    All other components within normal limits  WET PREP, GENITAL  LIPASE, BLOOD  I-STAT BETA HCG BLOOD, ED ([facility], [facility], [facility] ONLY)  GC/CHLAMYDIA PROBE AMP ([place]) NOT AT [facility]    EKG None  Radiology CT ABDOMEN PELVIS W CONTRAST  Result Date: 08/18/2022 CLINICAL DATA:  Right lower quadrant pain for 1 day EXAM: CT ABDOMEN AND PELVIS WITH CONTRAST TECHNIQUE: Multidetector CT imaging of the abdomen and pelvis was performed using the standard protocol following bolus administration of intravenous contrast. RADIATION DOSE REDUCTION: This  exam was performed according to the departmental dose-optimization program which includes automated exposure control, adjustment of the mA and/or kV according to patient size and/or use of iterative reconstruction technique. CONTRAST:  OMNIPAQUE IOHEXOL 300 MG/ML  SOLN COMPARISON:  Pelvic ultrasound from earlier today FINDINGS: Lower chest:  No contributory findings. Hepatobiliary: No focal liver abnormality.No evidence of biliary obstruction or stone. Pancreas: Unremarkable. Spleen: Unremarkable. Adrenals/Urinary Tract: Negative adrenals. No hydronephrosis or stone. Unremarkable bladder. Stomach/Bowel: No obstruction. No appendiceal thickening or mesial appendiceal inflammation. No visible bowel inflammation throughout the abdomen. Vascular/Lymphatic: No acute vascular abnormality. No mass or adenopathy. Reproductive:Corpus luteum on the right. Other: Small volume pelvic fluid which is low-density, usually physiologic. Musculoskeletal: No acute abnormalities. IMPRESSION: 1. Small volume pelvic fluid, usually physiologic. There is a corpus luteum on the right. 2. Negative for appendicitis. Electronically Signed   By: Tiburcio Pea M.D.   On: 08/18/2022 05:32   US Pelvis Complete  Result Date: 08/18/2022 CLINICAL DATA:  Right lower quadrant/pelvic pain. EXAM: TRANSABDOMINAL AND TRANSVAGINAL ULTRASOUND OF PELVIS DOPPLER  ULTRASOUND OF OVARIES TECHNIQUE: Both transabdominal and transvaginal ultrasound examinations of the pelvis were performed. Transabdominal technique was performed for global imaging of the pelvis including uterus, ovaries, adnexal regions, and pelvic cul-de-sac. It was necessary to proceed with endovaginal exam following the transabdominal exam to visualize the endometrium and ovaries. Color and duplex Doppler ultrasound was utilized to evaluate blood flow to the ovaries. COMPARISON:  None Available. FINDINGS: Uterus Measurements: 7.8 x 3.4 x 4.3 cm = volume: 60 mL. No fibroids or other mass visualized. Endometrium Thickness: 9.  No focal abnormality visualized. Right ovary Measurements: 3.4 x 1.6 x 2.2 cm = volume: 6 mL. Normal appearance/no adnexal mass. Left ovary Measurements: 2.8 x 3.1 x 2.1 cm = volume: 7 mL. Normal appearance/no adnexal mass. Pulsed Doppler evaluation of both ovaries demonstrates normal low-resistance arterial and venous waveforms. Other findings No abnormal free fluid. IMPRESSION: Unremarkable pelvic ultrasound. Electronically Signed   By: Elgie Collard M.D.   On: 08/18/2022 02:25   US Transvaginal Non-OB  Result Date: 08/18/2022 CLINICAL DATA:  Right lower quadrant/pelvic pain. EXAM: TRANSABDOMINAL AND TRANSVAGINAL ULTRASOUND OF PELVIS DOPPLER ULTRASOUND OF OVARIES TECHNIQUE: Both transabdominal and transvaginal ultrasound examinations of the pelvis were performed. Transabdominal technique was performed for global imaging of the pelvis including uterus, ovaries, adnexal regions, and pelvic cul-de-sac. It was necessary to proceed with endovaginal exam following the transabdominal exam to visualize the endometrium and ovaries. Color and duplex Doppler ultrasound was utilized to evaluate blood flow to the ovaries. COMPARISON:  None Available. FINDINGS: Uterus Measurements: 7.8 x 3.4 x 4.3 cm = volume: 60 mL. No fibroids or other mass visualized. Endometrium Thickness: 9.  No focal  abnormality visualized. Right ovary Measurements: 3.4 x 1.6 x 2.2 cm = volume: 6 mL. Normal appearance/no adnexal mass. Left ovary Measurements: 2.8 x 3.1 x 2.1 cm = volume: 7 mL. Normal appearance/no adnexal mass. Pulsed Doppler evaluation of both ovaries demonstrates normal low-resistance arterial and venous waveforms. Other findings No abnormal free fluid. IMPRESSION: Unremarkable pelvic ultrasound. Electronically Signed   By: Elgie Collard M.D.   On: 08/18/2022 02:25   [place] Art/Ven Flow Abd Pelv Doppler  Result Date: 08/18/2022 CLINICAL DATA:  Right lower quadrant/pelvic pain. EXAM: TRANSABDOMINAL AND TRANSVAGINAL ULTRASOUND OF PELVIS DOPPLER ULTRASOUND OF OVARIES TECHNIQUE: Both transabdominal and transvaginal ultrasound examinations of the pelvis were performed. Transabdominal technique was performed for global imaging of the pelvis including uterus, ovaries, adnexal regions, and pelvic  cul-de-sac. It was necessary to proceed with endovaginal exam following the transabdominal exam to visualize the endometrium and ovaries. Color and duplex Doppler ultrasound was utilized to evaluate blood flow to the ovaries. COMPARISON:  None Available. FINDINGS: Uterus Measurements: 7.8 x 3.4 x 4.3 cm = volume: 60 mL. No fibroids or other mass visualized. Endometrium Thickness: 9.  No focal abnormality visualized. Right ovary Measurements: 3.4 x 1.6 x 2.2 cm = volume: 6 mL. Normal appearance/no adnexal mass. Left ovary Measurements: 2.8 x 3.1 x 2.1 cm = volume: 7 mL. Normal appearance/no adnexal mass. Pulsed Doppler evaluation of both ovaries demonstrates normal low-resistance arterial and venous waveforms. Other findings No abnormal free fluid. IMPRESSION: Unremarkable pelvic ultrasound. Electronically Signed   By: Elgie CollardArash  Radparvar M.D.   On: 08/18/2022 02:25    Procedures Procedures    Medications Ordered in ED Medications  iohexol (OMNIPAQUE) 9 MG/ML oral solution 1,000 mL (has no administration in time range)   sodium chloride (PF) 0.9 % injection (has no administration in time range)  oxyCODONE-acetaminophen (PERCOCET/ROXICET) 5-325 MG per tablet 1 tablet (1 tablet Oral Given 08/18/22 0149)  LORazepam (ATIVAN) tablet 0.5 mg (0.5 mg Oral Given 08/18/22 0149)  iohexol (OMNIPAQUE) 300 MG/ML solution 100 mL (100 mLs Intravenous Contrast Given 08/18/22 0458)  ketorolac (TORADOL) 15 MG/ML injection 15 mg (15 mg Intravenous Given 08/18/22 0630)    ED Course/ Medical Decision Making/ A&P Clinical Course as of 08/18/22 0719  Wed Aug 18, 2022  0346 Patient refused IV for contrasted CT; will obtain noncon study.  [RS]  0411 Patient willing to use IV contrast at this time. Will proceed with contrasted CT.  [RS]    Clinical Course User Index [RS] Uchenna Rappaport, Eugene Gaviaebekah R, PA-C                           Medical Decision Making 21 year old female presents with concern for right lower abdominal pain that woke her from her sleep.  Vital signs are normal and intake.  Patient's cardiopulmonary exam is unremarkable, abdominal exam is above with McBurney point tenderness, obturator and psoas signs.   The diagnosis includes was limited to PD, appendicitis, STI, ovarian cyst or torsion, ectopic pregnancy, mittelschmerz, pyelonephritis, bowel obstruction, diverticulitis, ureterolithiasis. Discussed with patient that she may benefit from CT scan of the abdomen pelvis given concern for possible appendicitis and PID; patient unwilling to undergo IV placement at this time for contrasted study.  We will proceed with pelvic ultrasound first, pelvic exam recommended, patient deferred in preference of waiting till after pelvic ultrasound is complete.  Oral analgesia and anxiolysis offered.  Amount and/or Complexity of Data Reviewed Labs: ordered.    Details: CBC with leukocytosis of 13.5, CMP with hypokalemia of 3.1 otherwise unremarkable.  Lipase is normal.  UA with ketonuria and large leukocytes, 11-20 WBCs but no bacteria, equivocal  for infection but patient without urinary symptoms at this time.  Lipase is normal.  Patient is not pregnant. Radiology: ordered.    Details: CT abdomen pelvis negative for acute findings, scant fluid in the right pelvis likely physiologic, corpus luteum cyst on the right. visulaized by this provider.    Risk Prescription drug management.    Clinical picture most consistent with ovarian cyst that may have ruptured as etiology for patient's pain.  Abdomen is soft nondistended and nontender at this time.  Recommend NSAIDs at home, no further work-up warranted in the ER at this time.  Clinical concern for  emergent underlying etiology that warrant further ED work-up or inpatient management is exceedingly low.  Sayde and her mother voiced understanding of her medical evaluation and treatment plan. Each of their questions answered to their expressed satisfaction.  Return precautions were given.  Patient is well-appearing, stable, and was discharged in good condition..  This chart was dictated using voice recognition software, Dragon. Despite the best efforts of this provider to proofread and correct errors, errors may still occur which can change documentation meaning.   Final Clinical Impression(s) / ED Diagnoses Final diagnoses:  Right lower quadrant abdominal pain    Rx / DC Orders ED Discharge Orders     None         Sherrilee Gilles 08/18/22 0719    Palumbo, April, MD 08/18/22 3007

## 2022-08-19 ENCOUNTER — Emergency Department (HOSPITAL_COMMUNITY)
Admission: EM | Admit: 2022-08-19 | Discharge: 2022-08-19 | Disposition: A | Discharge status: Home / Self Care | Payer: Self-pay | Attending: Emergency Medicine | Admitting: Emergency Medicine

## 2022-08-19 ENCOUNTER — Encounter (HOSPITAL_COMMUNITY): Payer: Self-pay

## 2022-08-19 ENCOUNTER — Other Ambulatory Visit: Payer: Self-pay

## 2022-08-19 DIAGNOSIS — A749 Chlamydial infection, unspecified: Secondary | ICD-10-CM | POA: Insufficient documentation

## 2022-08-19 DIAGNOSIS — Z202 Contact with and (suspected) exposure to infections with a predominantly sexual mode of transmission: Secondary | ICD-10-CM | POA: Insufficient documentation

## 2022-08-19 MED ORDER — DOXYCYCLINE HYCLATE 100 MG PO CAPS: 100.0000 mg | ORAL_CAPSULE | Freq: Two times a day (BID) | ORAL | 0 refills | Status: DC

## 2022-08-19 NOTE — ED Triage Notes (Addendum)
Patient got tested for Chlamydia and it was positive on my chart and they told her to come back to get treatment. Patient is having white vaginal discharge, itchiness and abdominal pain.

## 2022-08-19 NOTE — ED Provider Notes (Signed)
[facility]-EMERGENCY DEPT Provider Note   CSN: [id number] Arrival date & time: 08/19/22  1840     History  Chief Complaint  Patient presents with   SEXUALLY TRANSMITTED DISEASE    Deborah Holland is a 21 y.o. female.  Pt reports she was seen 2 days ago.  Pt reports her chlamydia test came back positive.  Pt reports she came in to get a prescription.  Pt denies any fever.  Pt denies abdominal pain.  Pt reports abdominal pain has improved    The history is provided by the patient.  Exposure to STD This is a new problem. She has tried nothing for the symptoms.       Home Medications Prior to Admission medications   Medication Sig Start Date End Date Taking? Authorizing Provider  doxycycline (VIBRAMYCIN) 100 MG capsule Take 1 capsule (100 mg total) by mouth 2 (two) times daily. 08/19/22  Yes Elson Areas, PA-C  acetaminophen (TYLENOL) 325 MG tablet Take 2 tablets (650 mg total) by mouth every 6 (six) hours as needed for up to 30 doses for moderate pain, fever or headache. 06/02/22   Terald Sleeper, MD  albuterol (VENTOLIN HFA) 108 (90 Base) MCG/ACT inhaler Inhale 1-2 puffs into the lungs every 6 (six) hours as needed for wheezing or shortness of breath. 04/09/22   Smoot, Shawn Route, PA-C  amoxicillin (AMOXIL) 875 MG tablet Take 1 tablet (875 mg total) by mouth 2 (two) times daily. 07/09/15   Viviano Simas, NP  benzonatate (TESSALON) 100 MG capsule Take 1 capsule (100 mg total) by mouth every 8 (eight) hours. 04/09/22   Smoot, Shawn Route, PA-C  cyclobenzaprine (FLEXERIL) 10 MG tablet Take 1 tablet (10 mg total) by mouth 2 (two) times daily as needed for up to 14 doses for muscle spasms. 06/02/22   Terald Sleeper, MD  fluticasone (FLONASE) 50 MCG/ACT nasal spray Place 2 sprays into both nostrils daily. 09/23/16   Eliseo Squires, PA-C  ibuprofen (ADVIL) 600 MG tablet Take 1 tablet (600 mg total) by mouth every 6 (six) hours as needed for up to 30 doses for mild  pain, moderate pain or headache. 06/02/22   Trifan, Kermit Balo, MD  ibuprofen (ADVIL,MOTRIN) 600 MG tablet Take 1 tablet (600 mg total) by mouth every 6 (six) hours as needed. 09/23/16   Eliseo Squires, PA-C  methocarbamol (ROBAXIN) 500 MG tablet Take 1 tablet (500 mg total) by mouth every 8 (eight) hours as needed for muscle spasms. 06/25/22   Rancour, Jeannett Senior, MD  naproxen (NAPROSYN) 500 MG tablet Take 1 tablet (500 mg total) by mouth 2 (two) times daily as needed. 06/25/22   Rancour, Jeannett Senior, MD      Allergies    Other    Review of Systems   Review of Systems  All other systems reviewed and are negative.   Physical Exam Updated Vital Signs BP 111/73 (BP Location: Right Arm)   Pulse 70   Temp 98.3 F (36.8 C) (Oral)   Resp 18   LMP 07/26/2022 (Approximate) Comment: negative beta HCG 08/17/22  SpO2 100%  Physical Exam Vitals and nursing note reviewed.  Constitutional:      Appearance: She is well-developed.  HENT:     Head: Normocephalic.  Cardiovascular:     Rate and Rhythm: Normal rate.  Pulmonary:     Effort: Pulmonary effort is normal.  Abdominal:     General: There is no distension.  Musculoskeletal:  General: Normal range of motion.     Cervical back: Normal range of motion.  Skin:    General: Skin is warm.  Neurological:     General: No focal deficit present.     Mental Status: She is alert and oriented to person, place, and time.     ED Results / Procedures / Treatments   Labs (all labs ordered are listed, but only abnormal results are displayed) Labs Reviewed - No data to display  EKG None  Radiology CT ABDOMEN PELVIS W CONTRAST  Result Date: 08/18/2022 CLINICAL DATA:  Right lower quadrant pain for 1 day EXAM: CT ABDOMEN AND PELVIS WITH CONTRAST TECHNIQUE: Multidetector CT imaging of the abdomen and pelvis was performed using the standard protocol following bolus administration of intravenous contrast. RADIATION DOSE REDUCTION: This exam was  performed according to the departmental dose-optimization program which includes automated exposure control, adjustment of the mA and/or kV according to patient size and/or use of iterative reconstruction technique. CONTRAST:  OMNIPAQUE IOHEXOL 300 MG/ML  SOLN COMPARISON:  Pelvic ultrasound from earlier today FINDINGS: Lower chest:  No contributory findings. Hepatobiliary: No focal liver abnormality.No evidence of biliary obstruction or stone. Pancreas: Unremarkable. Spleen: Unremarkable. Adrenals/Urinary Tract: Negative adrenals. No hydronephrosis or stone. Unremarkable bladder. Stomach/Bowel: No obstruction. No appendiceal thickening or mesial appendiceal inflammation. No visible bowel inflammation throughout the abdomen. Vascular/Lymphatic: No acute vascular abnormality. No mass or adenopathy. Reproductive:Corpus luteum on the right. Other: Small volume pelvic fluid which is low-density, usually physiologic. Musculoskeletal: No acute abnormalities. IMPRESSION: 1. Small volume pelvic fluid, usually physiologic. There is a corpus luteum on the right. 2. Negative for appendicitis. Electronically Signed   By: Tiburcio Pea M.D.   On: 08/18/2022 05:32   US Pelvis Complete  Result Date: 08/18/2022 CLINICAL DATA:  Right lower quadrant/pelvic pain. EXAM: TRANSABDOMINAL AND TRANSVAGINAL ULTRASOUND OF PELVIS DOPPLER ULTRASOUND OF OVARIES TECHNIQUE: Both transabdominal and transvaginal ultrasound examinations of the pelvis were performed. Transabdominal technique was performed for global imaging of the pelvis including uterus, ovaries, adnexal regions, and pelvic cul-de-sac. It was necessary to proceed with endovaginal exam following the transabdominal exam to visualize the endometrium and ovaries. Color and duplex Doppler ultrasound was utilized to evaluate blood flow to the ovaries. COMPARISON:  None Available. FINDINGS: Uterus Measurements: 7.8 x 3.4 x 4.3 cm = volume: 60 mL. No fibroids or other mass  visualized. Endometrium Thickness: 9.  No focal abnormality visualized. Right ovary Measurements: 3.4 x 1.6 x 2.2 cm = volume: 6 mL. Normal appearance/no adnexal mass. Left ovary Measurements: 2.8 x 3.1 x 2.1 cm = volume: 7 mL. Normal appearance/no adnexal mass. Pulsed Doppler evaluation of both ovaries demonstrates normal low-resistance arterial and venous waveforms. Other findings No abnormal free fluid. IMPRESSION: Unremarkable pelvic ultrasound. Electronically Signed   By: Elgie Collard M.D.   On: 08/18/2022 02:25   US Transvaginal Non-OB  Result Date: 08/18/2022 CLINICAL DATA:  Right lower quadrant/pelvic pain. EXAM: TRANSABDOMINAL AND TRANSVAGINAL ULTRASOUND OF PELVIS DOPPLER ULTRASOUND OF OVARIES TECHNIQUE: Both transabdominal and transvaginal ultrasound examinations of the pelvis were performed. Transabdominal technique was performed for global imaging of the pelvis including uterus, ovaries, adnexal regions, and pelvic cul-de-sac. It was necessary to proceed with endovaginal exam following the transabdominal exam to visualize the endometrium and ovaries. Color and duplex Doppler ultrasound was utilized to evaluate blood flow to the ovaries. COMPARISON:  None Available. FINDINGS: Uterus Measurements: 7.8 x 3.4 x 4.3 cm = volume: 60 mL. No fibroids or other mass visualized.  Endometrium Thickness: 9.  No focal abnormality visualized. Right ovary Measurements: 3.4 x 1.6 x 2.2 cm = volume: 6 mL. Normal appearance/no adnexal mass. Left ovary Measurements: 2.8 x 3.1 x 2.1 cm = volume: 7 mL. Normal appearance/no adnexal mass. Pulsed Doppler evaluation of both ovaries demonstrates normal low-resistance arterial and venous waveforms. Other findings No abnormal free fluid. IMPRESSION: Unremarkable pelvic ultrasound. Electronically Signed   By: Elgie Collard M.D.   On: 08/18/2022 02:25   [place] Art/Ven Flow Abd Pelv Doppler  Result Date: 08/18/2022 CLINICAL DATA:  Right lower quadrant/pelvic pain. EXAM:  TRANSABDOMINAL AND TRANSVAGINAL ULTRASOUND OF PELVIS DOPPLER ULTRASOUND OF OVARIES TECHNIQUE: Both transabdominal and transvaginal ultrasound examinations of the pelvis were performed. Transabdominal technique was performed for global imaging of the pelvis including uterus, ovaries, adnexal regions, and pelvic cul-de-sac. It was necessary to proceed with endovaginal exam following the transabdominal exam to visualize the endometrium and ovaries. Color and duplex Doppler ultrasound was utilized to evaluate blood flow to the ovaries. COMPARISON:  None Available. FINDINGS: Uterus Measurements: 7.8 x 3.4 x 4.3 cm = volume: 60 mL. No fibroids or other mass visualized. Endometrium Thickness: 9.  No focal abnormality visualized. Right ovary Measurements: 3.4 x 1.6 x 2.2 cm = volume: 6 mL. Normal appearance/no adnexal mass. Left ovary Measurements: 2.8 x 3.1 x 2.1 cm = volume: 7 mL. Normal appearance/no adnexal mass. Pulsed Doppler evaluation of both ovaries demonstrates normal low-resistance arterial and venous waveforms. Other findings No abnormal free fluid. IMPRESSION: Unremarkable pelvic ultrasound. Electronically Signed   By: Elgie Collard M.D.   On: 08/18/2022 02:25    Procedures Procedures    Medications Ordered in ED Medications - No data to display  ED Course/ Medical Decision Making/ A&P                           Medical Decision Making Pt here for treatment of chlamydia   Amount and/or Complexity of Data Reviewed External Data Reviewed: labs and notes.    Details: ED notes reviewed  Risk Prescription drug management. Risk Details: Pt declined injection of rocephin.  Pt just wants chlamydia treatment            Final Clinical Impression(s) / ED Diagnoses Final diagnoses:  Chlamydia  STD exposure    Rx / DC Orders ED Discharge Orders          Ordered    doxycycline (VIBRAMYCIN) 100 MG capsule  2 times daily        08/19/22 2021          An After Visit Summary was  printed and given to the patient.     Osie Cheeks 08/19/22 2043    Charlynne Pander, MD 08/23/22 [phone]

## 2022-08-19 NOTE — Discharge Instructions (Addendum)
Return if any problems.

## 2022-08-22 ENCOUNTER — Emergency Department (HOSPITAL_COMMUNITY)
Admission: EM | Admit: 2022-08-22 | Discharge: 2022-08-22 | Disposition: A | Discharge status: Home / Self Care | Payer: Self-pay | Attending: Emergency Medicine | Admitting: Emergency Medicine

## 2022-08-22 ENCOUNTER — Encounter (HOSPITAL_COMMUNITY): Payer: Self-pay | Admitting: Emergency Medicine

## 2022-08-22 ENCOUNTER — Emergency Department (HOSPITAL_COMMUNITY): Payer: Self-pay

## 2022-08-22 ENCOUNTER — Other Ambulatory Visit: Payer: Self-pay

## 2022-08-22 DIAGNOSIS — M546 Pain in thoracic spine: Secondary | ICD-10-CM | POA: Insufficient documentation

## 2022-08-22 DIAGNOSIS — M25511 Pain in right shoulder: Secondary | ICD-10-CM | POA: Diagnosis not present

## 2022-08-22 DIAGNOSIS — S20219A Contusion of unspecified front wall of thorax, initial encounter: Secondary | ICD-10-CM | POA: Diagnosis not present

## 2022-08-22 DIAGNOSIS — M25512 Pain in left shoulder: Secondary | ICD-10-CM | POA: Insufficient documentation

## 2022-08-22 DIAGNOSIS — Y92410 Unspecified street and highway as the place of occurrence of the external cause: Secondary | ICD-10-CM | POA: Diagnosis not present

## 2022-08-22 DIAGNOSIS — M7918 Myalgia, other site: Secondary | ICD-10-CM

## 2022-08-22 DIAGNOSIS — M542 Cervicalgia: Secondary | ICD-10-CM | POA: Insufficient documentation

## 2022-08-22 DIAGNOSIS — S299XXA Unspecified injury of thorax, initial encounter: Secondary | ICD-10-CM | POA: Diagnosis present

## 2022-08-22 MED ORDER — METHOCARBAMOL 500 MG PO TABS
500.0000 mg | ORAL_TABLET | Freq: Two times a day (BID) | ORAL | 0 refills | Status: DC
Start: 2022-08-22 — End: 2022-11-30

## 2022-08-22 MED ORDER — NAPROXEN 500 MG PO TABS
500.0000 mg | ORAL_TABLET | Freq: Two times a day (BID) | ORAL | 0 refills | Status: DC
Start: 2022-08-22 — End: 2022-11-30
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
## 2022-08-22 NOTE — ED Notes (Signed)
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
## 2022-11-29 ENCOUNTER — Encounter (HOSPITAL_COMMUNITY)
Admission: AD | Disposition: A | Discharge status: Home / Self Care | Payer: Self-pay | Source: Home / Self Care | Attending: Emergency Medicine

## 2022-11-29 ENCOUNTER — Other Ambulatory Visit: Payer: Self-pay

## 2022-11-29 ENCOUNTER — Emergency Department (HOSPITAL_COMMUNITY): Payer: Self-pay

## 2022-11-29 ENCOUNTER — Inpatient Hospital Stay (HOSPITAL_COMMUNITY)
Admission: AD | Admit: 2022-11-29 | Discharge: 2022-11-30 | Disposition: A | Discharge status: Home / Self Care | Payer: Self-pay | Attending: Obstetrics & Gynecology | Admitting: Obstetrics & Gynecology

## 2022-11-29 ENCOUNTER — Encounter (HOSPITAL_COMMUNITY): Payer: Self-pay

## 2022-11-29 DIAGNOSIS — O00101 Right tubal pregnancy without intrauterine pregnancy: Secondary | ICD-10-CM | POA: Insufficient documentation

## 2022-11-29 DIAGNOSIS — Z3A00 Weeks of gestation of pregnancy not specified: Secondary | ICD-10-CM | POA: Insufficient documentation

## 2022-11-29 DIAGNOSIS — K661 Hemoperitoneum: Secondary | ICD-10-CM

## 2022-11-29 DIAGNOSIS — O081 Delayed or excessive hemorrhage following ectopic and molar pregnancy: Secondary | ICD-10-CM | POA: Insufficient documentation

## 2022-11-29 DIAGNOSIS — O009 Unspecified ectopic pregnancy without intrauterine pregnancy: Secondary | ICD-10-CM

## 2022-11-29 HISTORY — PX: LAPAROSCOPIC UNILATERAL SALPINGECTOMY: SHX5934

## 2022-11-29 HISTORY — DX: Unspecified asthma, uncomplicated: J45.909

## 2022-11-29 LAB — CBC WITH DIFFERENTIAL/PLATELET
Abs Immature Granulocytes: 0.07 10*3/uL (ref 0.00–0.07)
Abs Immature Granulocytes: 0.04 10*3/uL (ref 0.00–0.07)
Basophils Absolute: 0 10*3/uL (ref 0.0–0.1)
Basophils Absolute: 0 10*3/uL (ref 0.0–0.1)
Basophils Relative: 0 %
Basophils Relative: 0 %
Eosinophils Absolute: 0 10*3/uL (ref 0.0–0.5)
Eosinophils Absolute: 0 10*3/uL (ref 0.0–0.5)
Eosinophils Relative: 0 %
Eosinophils Relative: 0 %
HCT: 41.6 % (ref 36.0–46.0)
HCT: 33.3 % — ABNORMAL LOW (ref 36.0–46.0)
Hemoglobin: 13.6 g/dL (ref 12.0–15.0)
Hemoglobin: 11.6 g/dL — ABNORMAL LOW (ref 12.0–15.0)
Immature Granulocytes: 1 %
Immature Granulocytes: 0 %
Lymphocytes Relative: 12 %
Lymphocytes Relative: 11 %
Lymphs Abs: 1.7 10*3/uL (ref 0.7–4.0)
Lymphs Abs: 1.6 10*3/uL (ref 0.7–4.0)
MCH: 28.7 pg (ref 26.0–34.0)
MCH: 29.7 pg (ref 26.0–34.0)
MCHC: 32.7 g/dL (ref 30.0–36.0)
MCHC: 34.8 g/dL (ref 30.0–36.0)
MCV: 87.8 fL (ref 80.0–100.0)
MCV: 85.2 fL (ref 80.0–100.0)
Monocytes Absolute: 0.8 10*3/uL (ref 0.1–1.0)
Monocytes Absolute: 0.7 10*3/uL (ref 0.1–1.0)
Monocytes Relative: 5 %
Monocytes Relative: 5 %
Neutro Abs: 12.3 10*3/uL — ABNORMAL HIGH (ref 1.7–7.7)
Neutro Abs: 12.4 10*3/uL — ABNORMAL HIGH (ref 1.7–7.7)
Neutrophils Relative %: 82 %
Neutrophils Relative %: 84 %
Platelets: 284 10*3/uL (ref 150–400)
Platelets: 232 10*3/uL (ref 150–400)
RBC: 4.74 MIL/uL (ref 3.87–5.11)
RBC: 3.91 MIL/uL (ref 3.87–5.11)
RDW: 13.2 % (ref 11.5–15.5)
RDW: 13.2 % (ref 11.5–15.5)
WBC: 14.9 10*3/uL — ABNORMAL HIGH (ref 4.0–10.5)
WBC: 14.8 10*3/uL — ABNORMAL HIGH (ref 4.0–10.5)
nRBC: 0 % (ref 0.0–0.2)
nRBC: 0 % (ref 0.0–0.2)

## 2022-11-29 LAB — URINALYSIS, ROUTINE W REFLEX MICROSCOPIC
Bilirubin Urine: NEGATIVE
Glucose, UA: NEGATIVE mg/dL
Ketones, ur: 80 mg/dL — AB
Leukocytes,Ua: NEGATIVE
Nitrite: NEGATIVE
Protein, ur: 30 mg/dL — AB
Specific Gravity, Urine: 1.024 (ref 1.005–1.030)
pH: 7 (ref 5.0–8.0)

## 2022-11-29 LAB — TYPE AND SCREEN
ABO/RH(D): O POS
Antibody Screen: NEGATIVE

## 2022-11-29 LAB — COMPREHENSIVE METABOLIC PANEL
ALT: 18 U/L (ref 0–44)
AST: 17 U/L (ref 15–41)
Albumin: 4.4 g/dL (ref 3.5–5.0)
Alkaline Phosphatase: 52 U/L (ref 38–126)
Anion gap: 9 (ref 5–15)
BUN: 6 mg/dL (ref 6–20)
CO2: 22 mmol/L (ref 22–32)
Calcium: 9.7 mg/dL (ref 8.9–10.3)
Chloride: 106 mmol/L (ref 98–111)
Creatinine, Ser: 0.65 mg/dL (ref 0.44–1.00)
GFR, Estimated: >60 mL/min (ref >60)
Glucose, Bld: 97 mg/dL (ref 70–99)
Potassium: 3.1 mmol/L — ABNORMAL LOW (ref 3.5–5.1)
Sodium: 137 mmol/L (ref 135–145)
Total Bilirubin: 0.7 mg/dL (ref 0.3–1.2)
Total Protein: 8.4 g/dL — ABNORMAL HIGH (ref 6.5–8.1)

## 2022-11-29 LAB — HCG, QUANTITATIVE, PREGNANCY: hCG, Beta Chain, Quant, S: 1033 m[IU]/mL — ABNORMAL HIGH (ref <5)

## 2022-11-29 LAB — I-STAT CHEM 8, ED
BUN: 4 mg/dL — ABNORMAL LOW (ref 6–20)
Calcium, Ion: 1.08 mmol/L — ABNORMAL LOW (ref 1.15–1.40)
Chloride: 104 mmol/L (ref 98–111)
Creatinine, Ser: 0.5 mg/dL (ref 0.44–1.00)
Glucose, Bld: 108 mg/dL — ABNORMAL HIGH (ref 70–99)
HCT: 38 % (ref 36.0–46.0)
Hemoglobin: 12.9 g/dL (ref 12.0–15.0)
Potassium: 3.4 mmol/L — ABNORMAL LOW (ref 3.5–5.1)
Sodium: 137 mmol/L (ref 135–145)
TCO2: 21 mmol/L — ABNORMAL LOW (ref 22–32)

## 2022-11-29 LAB — PREGNANCY, URINE: Preg Test, Ur: POSITIVE — AB

## 2022-11-29 LAB — ABO/RH: ABO/RH(D): O POS

## 2022-11-29 SURGERY — SALPINGECTOMY, UNILATERAL, LAPAROSCOPIC
Anesthesia: General | Laterality: Right

## 2022-11-29 MED ORDER — LIDOCAINE 2% (20 MG/ML) 5 ML SYRINGE
INTRAMUSCULAR | Status: AC
  Filled 2022-11-29: qty 5

## 2022-11-29 MED ORDER — MIDAZOLAM HCL 2 MG/2ML IJ SOLN
INTRAMUSCULAR | Status: AC
  Filled 2022-11-29: qty 2

## 2022-11-29 MED ORDER — FENTANYL CITRATE (PF) 250 MCG/5ML IJ SOLN
INTRAMUSCULAR | Status: AC
  Filled 2022-11-29: qty 5

## 2022-11-29 MED ORDER — PROPOFOL 10 MG/ML IV BOLUS
INTRAVENOUS | Status: AC
  Filled 2022-11-29: qty 20

## 2022-11-29 MED ORDER — SUCCINYLCHOLINE CHLORIDE 200 MG/10ML IV SOSY
PREFILLED_SYRINGE | INTRAVENOUS | Status: AC
  Filled 2022-11-29: qty 10

## 2022-11-29 MED ORDER — SODIUM CHLORIDE 0.9 % IV BOLUS
1000.0000 mL | Freq: Once | INTRAVENOUS | Status: AC
  Administered 2022-11-29: 1000 mL via INTRAVENOUS

## 2022-11-29 MED ORDER — ONDANSETRON HCL 4 MG/2ML IJ SOLN
INTRAMUSCULAR | Status: AC
  Filled 2022-11-29: qty 2

## 2022-11-29 SURGICAL SUPPLY — 26 items
CATH ROBINSON RED A/P 16FR (CATHETERS) ×2 IMPLANT
DERMABOND ADVANCED .7 DNX12 (GAUZE/BANDAGES/DRESSINGS) ×2 IMPLANT
DURAPREP 26ML APPLICATOR (WOUND CARE) ×2 IMPLANT
GLOVE BIO SURGEON STRL SZ 6.5 (GLOVE) ×2 IMPLANT
GLOVE BIOGEL PI IND STRL 7.0 (GLOVE) ×6 IMPLANT
GLOVE ECLIPSE 7.0 STRL STRAW (GLOVE) ×2 IMPLANT
GOWN STRL REUS W/ TWL LRG LVL3 (GOWN DISPOSABLE) ×4 IMPLANT
GOWN STRL REUS W/TWL LRG LVL3 (GOWN DISPOSABLE) ×4
IRRIG SUCT STRYKERFLOW 2 WTIP (MISCELLANEOUS) ×2
IRRIGATION SUCT STRKRFLW 2 WTP (MISCELLANEOUS) ×1 IMPLANT
KIT TURNOVER KIT B (KITS) ×2 IMPLANT
NS IRRIG 1000ML POUR BTL (IV SOLUTION) ×2 IMPLANT
PACK LAPAROSCOPY BASIN (CUSTOM PROCEDURE TRAY) ×2 IMPLANT
PACK TRENDGUARD 450 HYBRID PRO (MISCELLANEOUS) ×1 IMPLANT
POUCH SPECIMEN RETRIEVAL 10MM (ENDOMECHANICALS) ×1 IMPLANT
PROTECTOR NERVE ULNAR (MISCELLANEOUS) ×4 IMPLANT
SET TUBE SMOKE EVAC HIGH FLOW (TUBING) ×2 IMPLANT
SHEARS HARMONIC ACE PLUS 36CM (ENDOMECHANICALS) ×1 IMPLANT
SLEEVE ENDOPATH XCEL 5M (ENDOMECHANICALS) ×2 IMPLANT
SUT MNCRL AB 4-0 PS2 18 (SUTURE) ×2 IMPLANT
SUT VICRYL 0 UR6 27IN ABS (SUTURE) ×1 IMPLANT
TOWEL GREEN STERILE FF (TOWEL DISPOSABLE) ×4 IMPLANT
TRAY FOLEY W/BAG SLVR 14FR (SET/KITS/TRAYS/PACK) ×1 IMPLANT
TRENDGUARD 450 HYBRID PRO PACK (MISCELLANEOUS) ×2
TROCAR XCEL NON-BLD 11X100MML (ENDOMECHANICALS) ×1 IMPLANT
TROCAR XCEL NON-BLD 5MMX100MML (ENDOMECHANICALS) ×2 IMPLANT

## 2022-11-29 NOTE — ED Notes (Signed)
EMS unable to transport pt, [facility] notified.

## 2022-11-29 NOTE — ED Provider Triage Note (Signed)
Emergency Medicine Provider Triage Evaluation Note  Deborah Holland , a 21 y.o. female  was evaluated in triage.  Pt complains of lower abdominal pain, spotting vaginal bleeding, and constipation.  Patient states she took a Plan B on 10/27/2022 and she has been feeling "off" since then.  She states she was supposed to have her period began on 11/18/2022 but did not have a normal amount of bleeding, has just been light spotting.  Denies vaginal discharge, recently completed treatment for BV.  Denies fevers, nausea, vomiting, diarrhea.  Review of Systems  Positive: As above Negative: As above  Physical Exam  BP 125/86   Pulse 88   Temp 98 F (36.7 C)   Resp (!) 21   Ht 5\' 3"  (1.6 m)   SpO2 100%   BMI 20.05 kg/m  Gen:   Awake, no distress   Resp:  Normal effort  MSK:   Moves extremities without difficulty  Other:  Abdomen soft, nontender and lower quadrants were significantly on the lower right side  Medical Decision Making  Medically screening exam initiated at 6:44 PM.  Appropriate orders placed.  Ralph Benavidez was informed that the remainder of the evaluation will be completed by another provider, this initial triage assessment does not replace that evaluation, and the importance of remaining in the ED until their evaluation is complete.     Bella Kennedy R, Melton Alar 11/29/22 [phone]

## 2022-11-29 NOTE — H&P (Signed)
Preoperative History and Physical  Deborah KennedyLakendra Holland is a 21 y.o. G1P0 here for surgical management of ruptured ectopic pregnancy.  [facility] for abdominal pain x 2 days and nausea. Evaluation showed stable vital signs, hemoglobin of 13.6, HCG 1033, and ultrasound showed  ruptured ectopic pregnancy with hemoperitoneum. She [facility] for further management. On arrival here, she still reported abdominal pain and mild nausea. No solid food all day, had a Icee slushie around 1845.  No significant preoperative concerns.  Proposed surgery: Laparoscopy, removal of fallopian tube and ectopic pregnancy.  Past Medical History:  Diagnosis Date   Asthma    History reviewed. No pertinent surgical history. OB History  Gravida Para Term Preterm AB Living  1            SAB IAB Ectopic Multiple Live Births               # Outcome Date GA Lbr Len/2nd Weight Sex Delivery Anes PTL Lv  1 Current           Was treated for chlamydia in 08/2022.  Patient denies any other pertinent gynecologic issues.   No current facility-administered medications on file prior to encounter.   Current Outpatient Medications on File Prior to Encounter  Medication Sig Dispense Refill   acetaminophen (TYLENOL) 500 MG tablet Take 500 mg by mouth every 6 (six) hours as needed.     doxycycline (VIBRAMYCIN) 100 MG capsule Take 1 capsule (100 mg total) by mouth 2 (two) times daily. 20 capsule 0   magnesium hydroxide (MILK OF MAGNESIA) 400 MG/5ML suspension Take 30 mLs by mouth daily as needed for mild constipation.     methocarbamol (ROBAXIN) 500 MG tablet Take 1 tablet (500 mg total) by mouth 2 (two) times daily. 20 tablet 0   naproxen (NAPROSYN) 500 MG tablet Take 1 tablet (500 mg total) by mouth 2 (two) times daily. (Patient not taking: Reported on 11/29/2022) 20 tablet 0   Allergies  Allergen Reactions   Other     08/30/17 : Varicella vaccine: Reaction: eyes swelling per mother.     Social History:   reports that she has never smoked. She has never used smokeless tobacco. She reports current drug use. Drug: Marijuana. She reports that she does not drink alcohol.  History reviewed. No pertinent family history.  Review of Systems: Pertinent items noted in HPI and remainder of comprehensive ROS otherwise negative.  PHYSICAL EXAM: Blood pressure 125/77, pulse 90, temperature 98.3 F (36.8 C), temperature source Oral, resp. rate 14, height 5\' 3"  (1.6 m), weight 49 kg, last menstrual period 11/18/2022, SpO2 100 %. CONSTITUTIONAL: Well-developed, well-nourished female in no acute distress.  HENT:  Normocephalic, atraumatic, External right and left ear normal. Oropharynx is clear and moist EYES: Conjunctivae and EOM are normal. Pupils are equal, round, and reactive to light. No scleral icterus.  NECK: Normal range of motion, supple, no masses SKIN: Skin is warm and dry. No rash noted. Not diaphoretic. No erythema. No pallor. NEUROLOGIC: Alert and oriented to person, place, and time. Normal reflexes, muscle tone coordination. No cranial nerve deficit noted. PSYCHIATRIC: Normal mood and affect. Normal behavior. Normal judgment and thought content. CARDIOVASCULAR: Normal heart rate noted, regular rhythm RESPIRATORY: Effort and breath sounds normal, no problems with respiration noted ABDOMEN: Soft, diffuse tenderness, R>>L, no rebound, no guarding PELVIC: Deferred MUSCULOSKELETAL: Normal range of motion. No edema and no tenderness. 2+ distal pulses.  Labs: Results  for orders placed or performed during the hospital encounter of 11/29/22 (from the past 336 hour(s))  Urinalysis, Routine w reflex microscopic   Collection Time: 11/29/22  7:52 PM  Result Value Ref Range   Color, Urine YELLOW YELLOW   APPearance HAZY (A) CLEAR   Specific Gravity, Urine 1.024 1.005 - 1.030   pH 7.0 5.0 - 8.0   Glucose, UA NEGATIVE NEGATIVE mg/dL   Hgb urine dipstick LARGE (A) NEGATIVE    Bilirubin Urine NEGATIVE NEGATIVE   Ketones, ur 80 (A) NEGATIVE mg/dL   Protein, ur 30 (A) NEGATIVE mg/dL   Nitrite NEGATIVE NEGATIVE   Leukocytes,Ua NEGATIVE NEGATIVE   RBC / HPF 0-5 0 - 5 RBC/hpf   WBC, UA 0-5 0 - 5 WBC/hpf   Bacteria, UA RARE (A) NONE SEEN   Squamous Epithelial / LPF 6-10 0 - 5   Mucus PRESENT   ABO/Rh   Collection Time: 11/29/22  8:11 PM  Result Value Ref Range   ABO/RH(D)      O POS Performed at [facility], [address], [place], [place] [address]   Comprehensive metabolic panel   Collection Time: 11/29/22  8:12 PM  Result Value Ref Range   Sodium 137 135 - 145 mmol/L   Potassium 3.1 (L) 3.5 - 5.1 mmol/L   Chloride 106 98 - 111 mmol/L   CO2 22 22 - 32 mmol/L   Glucose, Bld 97 70 - 99 mg/dL   BUN 6 6 - 20 mg/dL   Creatinine, Ser 7.67 0.44 - 1.00 mg/dL   Calcium 9.7 8.9 - 20.9 mg/dL   Total Protein 8.4 (H) 6.5 - 8.1 g/dL   Albumin 4.4 3.5 - 5.0 g/dL   AST 17 15 - 41 U/L   ALT 18 0 - 44 U/L   Alkaline Phosphatase 52 38 - 126 U/L   Total Bilirubin 0.7 0.3 - 1.2 mg/dL   GFR, Estimated >47 >09 mL/min   Anion gap 9 5 - 15  CBC with Differential   Collection Time: 11/29/22  8:12 PM  Result Value Ref Range   WBC 14.9 (H) 4.0 - 10.5 K/uL   RBC 4.74 3.87 - 5.11 MIL/uL   Hemoglobin 13.6 12.0 - 15.0 g/dL   HCT 62.8 36.6 - 29.4 %   MCV 87.8 80.0 - 100.0 fL   MCH 28.7 26.0 - 34.0 pg   MCHC 32.7 30.0 - 36.0 g/dL   RDW 76.5 46.5 - 03.5 %   Platelets 284 150 - 400 K/uL   nRBC 0.0 0.0 - 0.2 %   Neutrophils Relative % 82 %   Neutro Abs 12.3 (H) 1.7 - 7.7 K/uL   Lymphocytes Relative 12 %   Lymphs Abs 1.7 0.7 - 4.0 K/uL   Monocytes Relative 5 %   Monocytes Absolute 0.8 0.1 - 1.0 K/uL   Eosinophils Relative 0 %   Eosinophils Absolute 0.0 0.0 - 0.5 K/uL   Basophils Relative 0 %   Basophils Absolute 0.0 0.0 - 0.1 K/uL   Immature Granulocytes 1 %   Abs Immature Granulocytes 0.07 0.00 - 0.07 K/uL  hCG, quantitative, pregnancy    Collection Time: 11/29/22  8:12 PM  Result Value Ref Range   hCG, Beta Chain, Quant, S 1,033 (H) <5 mIU/mL  Pregnancy, urine   Collection Time: 11/29/22  8:14 PM  Result Value Ref Range   Preg Test, Ur POSITIVE (A) NEGATIVE  Type and screen   Collection Time: 11/29/22  9:06 PM  Result Value Ref Range   ABO/RH(D) O POS    Antibody Screen NEG    Sample Expiration      12/02/2022,2359 Performed at [facility], [address], [place], [place] [address]   I-stat chem 8, ED (not at [facility], [facility] or [facility])   Collection Time: 11/29/22  9:13 PM  Result Value Ref Range   Sodium 137 135 - 145 mmol/L   Potassium 3.4 (L) 3.5 - 5.1 mmol/L   Chloride 104 98 - 111 mmol/L   BUN 4 (L) 6 - 20 mg/dL   Creatinine, Ser 9.38 0.44 - 1.00 mg/dL   Glucose, Bld 182 (H) 70 - 99 mg/dL   Calcium, Ion 9.93 (L) 1.15 - 1.40 mmol/L   TCO2 21 (L) 22 - 32 mmol/L   Hemoglobin 12.9 12.0 - 15.0 g/dL   HCT 71.6 96.7 - 89.3 %  Type and screen [facility]   Collection Time: 11/29/22 11:17 PM  Result Value Ref Range   ABO/RH(D) PENDING    Antibody Screen PENDING    Sample Expiration      12/02/2022,2359 Performed at [facility], [address], [place], [place] [address]   CBC with Differential/Platelet   Collection Time: 11/29/22 11:23 PM  Result Value Ref Range   WBC 14.8 (H) 4.0 - 10.5 K/uL   RBC 3.91 3.87 - 5.11 MIL/uL   Hemoglobin 11.6 (L) 12.0 - 15.0 g/dL   HCT 51.0 (L) 25.8 - 52.7 %   MCV 85.2 80.0 - 100.0 fL   MCH 29.7 26.0 - 34.0 pg   MCHC 34.8 30.0 - 36.0 g/dL   RDW 78.2 42.3 - 53.6 %   Platelets 232 150 - 400 K/uL   nRBC 0.0 0.0 - 0.2 %   Neutrophils Relative % 84 %   Neutro Abs 12.4 (H) 1.7 - 7.7 K/uL   Lymphocytes Relative 11 %   Lymphs Abs 1.6 0.7 - 4.0 K/uL   Monocytes Relative 5 %   Monocytes Absolute 0.7 0.1 - 1.0 K/uL   Eosinophils Relative 0 %   Eosinophils Absolute 0.0 0.0 - 0.5 K/uL   Basophils Relative 0 %   Basophils Absolute 0.0 0.0 -  0.1 K/uL   Immature Granulocytes 0 %   Abs Immature Granulocytes 0.04 0.00 - 0.07 K/uL    Imaging Studies: US OB LESS THAN 14 WEEKS WITH OB TRANSVAGINAL  Result Date: 11/29/2022 CLINICAL DATA:  Pelvic pain EXAM: OBSTETRIC <14 WK [place] AND TRANSVAGINAL OB US TECHNIQUE: Both transabdominal and transvaginal ultrasound examinations were performed for complete evaluation of the gestation as well as the maternal uterus, adnexal regions, and pelvic cul-de-sac. Transvaginal technique was performed to assess early pregnancy. COMPARISON:  None Available. FINDINGS: Intrauterine gestational sac: Not visualized Yolk sac:  Not visualized Embryo:  Not visualized Maternal uterus/adnexae: Left ovary measures 2.3 x 1.7 by 2 cm. The right ovary measures 2.7 x 3.4 x 2.2 cm. Large heterogeneous echogenic material and or mass adjacent/surrounding right ovary. This measures approximately 6.3 x 3.9 x 4.4 cm. Large volume echogenic fluid within the pelvis. Limited Doppler images were obtained demonstrating arterial and venous flow within the bilateral ovaries. IMPRESSION: 1. No IUP identified. Large heterogeneous echogenic mass at the right adnexa with large volume echogenic fluid in the pelvis, the findings are consistent with a ruptured ectopic pregnancy. 2. Critical Value/emergent results were called by telephone at the time of interpretation on 11/29/2022 at 8:44 pm to provider [facility] , who  verbally acknowledged these results. Electronically Signed   By: Jasmine Pang M.D.   On: 11/29/2022 20:44    Assessment: Patient Active Problem List   Diagnosis Date Noted   Ruptured tubal ectopic pregnancy causing hemoperitoneum 11/29/2022    Plan: Patient will undergo surgical management with laparoscopic removal of fallopian tube and ectopic pregnancy.   The risks of surgery were discussed in detail with the patient including but not limited to: bleeding which may require transfusion or reoperation; infection which may  require antibiotics; injury to surrounding organs which may involve bowel, bladder, ureters; need for additional procedures including laparotomy or subsequent procedures secondary to abnormal pathology; thromboembolic phenomenon, surgical site problems and other postoperative/anesthesia complications. Likelihood of success in alleviating the patient's condition was discussed. Routine postoperative instructions will be reviewed with the patient and her family in detail after surgery.  The patient concurred with the proposed plan, giving informed written consent for the surgery.  Patient has been NPO since last night and she will remain NPO for procedure.  Anesthesia and OR aware.  Preoperative prophylactic antibiotics and SCDs ordered on call to the OR.  To OR when ready.    Jaynie Collins, MD, FACOG Obstetrician & Gynecologist, [facility] [employer], [facility]

## 2022-11-29 NOTE — ED Notes (Signed)
EMS called to transport patient to MAU.

## 2022-11-29 NOTE — ED Provider Notes (Signed)
[facility]-EMERGENCY DEPT Provider Note   CSN: [id number] Arrival date & time: 11/29/22  1632     History  Chief Complaint  Patient presents with   Abdominal Pain    Deborah Holland is a 21 y.o. female.  Patient presents with lower abdominal pain that has been severe for couple days.  She took Plan B on November 15.  Patient complains of nausea.  The history is provided by the patient and medical records. No language interpreter was used.  Abdominal Pain Pain location:  RLQ Pain quality: aching   Pain radiates to:  Does not radiate Pain severity:  Moderate Onset quality:  Sudden Timing:  Constant Progression:  Worsening Chronicity:  New Context: not alcohol use   Relieved by:  Nothing Worsened by:  Nothing Ineffective treatments:  None tried Associated symptoms: no chest pain, no cough, no diarrhea, no fatigue and no hematuria        Home Medications Prior to Admission medications   Medication Sig Start Date End Date Taking? Authorizing Provider  doxycycline (VIBRAMYCIN) 100 MG capsule Take 1 capsule (100 mg total) by mouth 2 (two) times daily. 08/19/22   Elson Areas, PA-C  methocarbamol (ROBAXIN) 500 MG tablet Take 1 tablet (500 mg total) by mouth 2 (two) times daily. 08/22/22   Renne Crigler, PA-C  naproxen (NAPROSYN) 500 MG tablet Take 1 tablet (500 mg total) by mouth 2 (two) times daily. 08/22/22   Renne Crigler, PA-C      Allergies    Other    Review of Systems   Review of Systems  Constitutional:  Negative for appetite change and fatigue.  HENT:  Negative for congestion, ear discharge and sinus pressure.   Eyes:  Negative for discharge.  Respiratory:  Negative for cough.   Cardiovascular:  Negative for chest pain.  Gastrointestinal:  Positive for abdominal pain. Negative for diarrhea.  Genitourinary:  Negative for frequency and hematuria.  Musculoskeletal:  Negative for back pain.  Skin:  Negative for rash.  Neurological:   Negative for seizures and headaches.  Psychiatric/Behavioral:  Negative for hallucinations.     Physical Exam Updated Vital Signs BP 126/84   Pulse (!) 59   Temp 98 F (36.7 C)   Resp 11   Ht 5\' 3"  (1.6 m)   Wt 49 kg   LMP 11/18/2022   SpO2 100%   BMI 19.13 kg/m  Physical Exam Vitals and nursing note reviewed.  Constitutional:      General: She is in acute distress.     Appearance: She is well-developed.  HENT:     Head: Normocephalic.     Mouth/Throat:     Mouth: Mucous membranes are moist.  Eyes:     General: No scleral icterus.    Conjunctiva/sclera: Conjunctivae normal.  Neck:     Thyroid: No thyromegaly.  Cardiovascular:     Rate and Rhythm: Normal rate and regular rhythm.     Heart sounds: No murmur heard.    No friction rub. No gallop.  Pulmonary:     Breath sounds: No stridor. No wheezing or rales.  Chest:     Chest wall: No tenderness.  Abdominal:     General: There is no distension.     Tenderness: There is abdominal tenderness. There is no rebound.     Comments: Tender right lower quadrant.  Musculoskeletal:        General: Normal range of motion.     Cervical back: Neck supple.  Lymphadenopathy:     Cervical: No cervical adenopathy.  Skin:    Findings: No erythema or rash.  Neurological:     Mental Status: She is alert and oriented to person, place, and time.     Motor: No abnormal muscle tone.     Coordination: Coordination normal.  Psychiatric:        Behavior: Behavior normal.     ED Results / Procedures / Treatments   Labs (all labs ordered are listed, but only abnormal results are displayed) Labs Reviewed  COMPREHENSIVE METABOLIC PANEL - Abnormal; Notable for the following components:      Result Value   Potassium 3.1 (*)    Total Protein 8.4 (*)    All other components within normal limits  URINALYSIS, ROUTINE W REFLEX MICROSCOPIC - Abnormal; Notable for the following components:   APPearance HAZY (*)    Hgb urine dipstick LARGE  (*)    Ketones, ur 80 (*)    Protein, ur 30 (*)    Bacteria, UA RARE (*)    All other components within normal limits  CBC WITH DIFFERENTIAL/PLATELET - Abnormal; Notable for the following components:   WBC 14.9 (*)    Neutro Abs 12.3 (*)    All other components within normal limits  PREGNANCY, URINE - Abnormal; Notable for the following components:   Preg Test, Ur POSITIVE (*)    All other components within normal limits  I-STAT CHEM 8, ED - Abnormal; Notable for the following components:   Potassium 3.4 (*)    BUN 4 (*)    Glucose, Bld 108 (*)    Calcium, Ion 1.08 (*)    TCO2 21 (*)    All other components within normal limits  HCG, QUANTITATIVE, PREGNANCY  TYPE AND SCREEN    EKG None  Radiology US OB LESS THAN 14 WEEKS WITH OB TRANSVAGINAL  Result Date: 11/29/2022 CLINICAL DATA:  Pelvic pain EXAM: OBSTETRIC <14 WK [place] AND TRANSVAGINAL OB US TECHNIQUE: Both transabdominal and transvaginal ultrasound examinations were performed for complete evaluation of the gestation as well as the maternal uterus, adnexal regions, and pelvic cul-de-sac. Transvaginal technique was performed to assess early pregnancy. COMPARISON:  None Available. FINDINGS: Intrauterine gestational sac: Not visualized Yolk sac:  Not visualized Embryo:  Not visualized Maternal uterus/adnexae: Left ovary measures 2.3 x 1.7 by 2 cm. The right ovary measures 2.7 x 3.4 x 2.2 cm. Large heterogeneous echogenic material and or mass adjacent/surrounding right ovary. This measures approximately 6.3 x 3.9 x 4.4 cm. Large volume echogenic fluid within the pelvis. Limited Doppler images were obtained demonstrating arterial and venous flow within the bilateral ovaries. IMPRESSION: 1. No IUP identified. Large heterogeneous echogenic mass at the right adnexa with large volume echogenic fluid in the pelvis, the findings are consistent with a ruptured ectopic pregnancy. 2. Critical Value/emergent results were called by telephone at the  time of interpretation on 11/29/2022 at 8:44 pm to provider [facility] , who verbally acknowledged these results. Electronically Signed   By: Jasmine Pang M.D.   On: 11/29/2022 20:44    Procedures Procedures    Medications Ordered in ED Medications  sodium chloride 0.9 % bolus 1,000 mL (1,000 mLs Intravenous New Bag/Given 11/29/22 2107)    ED Course/ Medical Decision Making/ A&P  CRITICAL CARE Performed by: Bethann Berkshire Total critical care time: 35 minutes Critical care time was exclusive of separately billable procedures and treating other patients. Critical care was necessary to treat or prevent imminent or life-threatening  deterioration. Critical care was time spent personally by me on the following activities: development of treatment plan with patient and/or surrogate as well as nursing, discussions with consultants, evaluation of patient's response to treatment, examination of patient, obtaining history from patient or surrogate, ordering and performing treatments and interventions, ordering and review of laboratory studies, ordering and review of radiographic studies, pulse oximetry and re-evaluation of patient's condition. Patient with a ruptured ectopic pregnancy.  I spoke with Dr.Ugonna Anyanwu is the accepting OB/GYN doctor                         Medical Decision Making Amount and/or Complexity of Data Reviewed Labs: ordered.  Risk Decision regarding hospitalization.  This patient presents to the ED for concern of Donnell pain, this involves an extensive number of treatment options, and is a complaint that carries with it a high risk of complications and morbidity.  The differential diagnosis includes sinusitis, ectopic pregnancy   Co morbidities that complicate the patient evaluation  None   Additional history obtained:  Additional history obtained from patient External records from outside source obtained and reviewed including hospital records   Lab  Tests:  I Ordered, and personally interpreted labs.  The pertinent results include: White count 15,000, hemoglobin 13, quantitative beta-hCG 1000   Imaging Studies ordered:  I ordered imaging studies including ultrasound of pelvis I independently visualized and interpreted imaging which showed ectopic pregnancy I agree with the radiologist interpretation   Cardiac Monitoring: / EKG:  The patient was maintained on a cardiac monitor.  I personally viewed and interpreted the cardiac monitored which showed an underlying rhythm of: nsr   Consultations Obtained:  I requested consultation with the OB,  and discussed lab and imaging findings as well as pertinent plan - they recommend: Transfer to [facility]   Problem List / ED Course / Critical interventions / Medication management  Abdominal pain and ectopic pregnancy I ordered medication including saline for dehydration Reevaluation of the patient after these medicines showed that the patient stayed the same I have reviewed the patients home medicines and have made adjustments as needed   Social Determinants of Health:  None   Test / Admission - Considered:  None  Ruptured ectopic pregnancy presenting with abdominal pain        Final Clinical Impression(s) / ED Diagnoses Final diagnoses:  Ruptured ectopic pregnancy    Rx / DC Orders ED Discharge Orders     None         Bethann Berkshire, MD 12/09/22 1601

## 2022-11-29 NOTE — ED Notes (Signed)
[facility] arrived for transport 

## 2022-11-29 NOTE — ED Triage Notes (Signed)
Patient c/o lower abdominal pain and right abdominal pain. Patient states she was suppose to have a period on 11/18/22, but did not have a normal period-just spotting.  Patient states she took Plan B on 10/27/22.

## 2022-11-29 NOTE — MAU Note (Addendum)
.  Deborah Holland is a 21 y.o. at Unknown here in MAU reporting: report received from [facility] with report of R side cramping and sometimes stabbing pain Onset of complaint: Pt states she took plan B on December 7th and began cramping and having spotting. Today cramping became severe and she was seen at [facility] showed ruptured ectopic  Pain score: 10/10 with movement  Vitals:   11/29/22 2145 11/29/22 2200  BP: 126/68 125/77  Pulse: 64 90  Resp: 14 14  Temp:    SpO2: 100% 100%         Dr.Anyanwu at bedside discussing ultrasound results and treatment plan. Pts mother at bedside

## 2022-11-30 ENCOUNTER — Inpatient Hospital Stay (HOSPITAL_COMMUNITY): Payer: Self-pay | Admitting: Certified Registered Nurse Anesthetist

## 2022-11-30 ENCOUNTER — Other Ambulatory Visit: Payer: Self-pay

## 2022-11-30 ENCOUNTER — Encounter (HOSPITAL_COMMUNITY): Payer: Self-pay | Admitting: Obstetrics & Gynecology

## 2022-11-30 ENCOUNTER — Inpatient Hospital Stay (EMERGENCY_DEPARTMENT_HOSPITAL): Payer: Self-pay | Admitting: Certified Registered Nurse Anesthetist

## 2022-11-30 DIAGNOSIS — O00101 Right tubal pregnancy without intrauterine pregnancy: Secondary | ICD-10-CM

## 2022-11-30 DIAGNOSIS — K661 Hemoperitoneum: Secondary | ICD-10-CM

## 2022-11-30 DIAGNOSIS — O009 Unspecified ectopic pregnancy without intrauterine pregnancy: Secondary | ICD-10-CM

## 2022-11-30 LAB — TYPE AND SCREEN
ABO/RH(D): O POS
Antibody Screen: NEGATIVE

## 2022-11-30 MED ORDER — OXYCODONE HCL 5 MG PO TABS: 5.0000 mg | ORAL_TABLET | ORAL | 0 refills | Status: DC | PRN

## 2022-11-30 MED ORDER — IBUPROFEN 600 MG PO TABS: 600.0000 mg | ORAL_TABLET | Freq: Four times a day (QID) | ORAL | 2 refills | Status: DC | PRN

## 2022-11-30 MED ORDER — PROMETHAZINE HCL 25 MG/ML IJ SOLN: 6.2500 mg | INTRAMUSCULAR | Status: DC | PRN

## 2022-11-30 MED ORDER — LIDOCAINE HCL (CARDIAC) PF 100 MG/5ML IV SOSY
PREFILLED_SYRINGE | INTRAVENOUS | Status: DC | PRN
  Administered 2022-11-30: 40 mg via INTRAVENOUS

## 2022-11-30 MED ORDER — ROCURONIUM BROMIDE 100 MG/10ML IV SOLN
INTRAVENOUS | Status: DC | PRN
  Administered 2022-11-30: 40 mg via INTRAVENOUS

## 2022-11-30 MED ORDER — LACTATED RINGERS IV SOLN: INTRAVENOUS | Status: DC | PRN

## 2022-11-30 MED ORDER — OXYCODONE HCL 5 MG PO TABS: 5.0000 mg | ORAL_TABLET | Freq: Once | ORAL | Status: DC | PRN

## 2022-11-30 MED ORDER — ACETAMINOPHEN 325 MG PO TABS: 325.0000 mg | ORAL_TABLET | ORAL | Status: DC | PRN

## 2022-11-30 MED ORDER — ACETAMINOPHEN 160 MG/5ML PO SOLN: 325.0000 mg | ORAL | Status: DC | PRN

## 2022-11-30 MED ORDER — CEFAZOLIN SODIUM-DEXTROSE 2-3 GM-%(50ML) IV SOLR
INTRAVENOUS | Status: DC | PRN
  Administered 2022-11-30: 2 g via INTRAVENOUS

## 2022-11-30 MED ORDER — SUCCINYLCHOLINE CHLORIDE 200 MG/10ML IV SOSY
PREFILLED_SYRINGE | INTRAVENOUS | Status: DC | PRN
  Administered 2022-11-30: 120 mg via INTRAVENOUS

## 2022-11-30 MED ORDER — CEFAZOLIN SODIUM 1 G IJ SOLR
INTRAMUSCULAR | Status: AC
  Filled 2022-11-30: qty 20

## 2022-11-30 MED ORDER — AMISULPRIDE (ANTIEMETIC) 5 MG/2ML IV SOLN: 10.0000 mg | Freq: Once | INTRAVENOUS | Status: DC | PRN

## 2022-11-30 MED ORDER — PROPOFOL 10 MG/ML IV BOLUS
INTRAVENOUS | Status: DC | PRN
  Administered 2022-11-30: 140 mg via INTRAVENOUS

## 2022-11-30 MED ORDER — MIDAZOLAM HCL 5 MG/5ML IJ SOLN
INTRAMUSCULAR | Status: DC | PRN
  Administered 2022-11-30: 2 mg via INTRAVENOUS

## 2022-11-30 MED ORDER — DEXAMETHASONE SODIUM PHOSPHATE 10 MG/ML IJ SOLN
INTRAMUSCULAR | Status: DC | PRN
  Administered 2022-11-30: 10 mg via INTRAVENOUS

## 2022-11-30 MED ORDER — SODIUM CHLORIDE 0.9 % IR SOLN
Status: DC | PRN
  Administered 2022-11-30: 3000 mL

## 2022-11-30 MED ORDER — ACETAMINOPHEN 10 MG/ML IV SOLN
INTRAVENOUS | Status: DC | PRN
  Administered 2022-11-30: 1000 mg via INTRAVENOUS

## 2022-11-30 MED ORDER — BUPIVACAINE HCL (PF) 0.5 % IJ SOLN
INTRAMUSCULAR | Status: DC | PRN
  Administered 2022-11-30: 9 mL

## 2022-11-30 MED ORDER — SUGAMMADEX SODIUM 200 MG/2ML IV SOLN
INTRAVENOUS | Status: DC | PRN
  Administered 2022-11-30: 200 mg via INTRAVENOUS

## 2022-11-30 MED ORDER — OXYCODONE HCL 5 MG/5ML PO SOLN: 5.0000 mg | Freq: Once | ORAL | Status: DC | PRN

## 2022-11-30 MED ORDER — KETOROLAC TROMETHAMINE 30 MG/ML IJ SOLN
INTRAMUSCULAR | Status: DC | PRN
  Administered 2022-11-30: 30 mg via INTRAVENOUS

## 2022-11-30 MED ORDER — DOCUSATE SODIUM 100 MG PO CAPS: 100.0000 mg | ORAL_CAPSULE | Freq: Two times a day (BID) | ORAL | 2 refills | Status: AC | PRN

## 2022-11-30 MED ORDER — KETOROLAC TROMETHAMINE 30 MG/ML IJ SOLN
INTRAMUSCULAR | Status: AC
  Filled 2022-11-30: qty 1

## 2022-11-30 MED ORDER — ACETAMINOPHEN 10 MG/ML IV SOLN
INTRAVENOUS | Status: AC
  Filled 2022-11-30: qty 100

## 2022-11-30 MED ORDER — DEXMEDETOMIDINE HCL IN NACL 80 MCG/20ML IV SOLN
INTRAVENOUS | Status: DC | PRN
  Administered 2022-11-30: 12 ug via BUCCAL
  Administered 2022-11-30: 8 ug via BUCCAL

## 2022-11-30 MED ORDER — FENTANYL CITRATE (PF) 100 MCG/2ML IJ SOLN
INTRAMUSCULAR | Status: DC | PRN
  Administered 2022-11-30: 100 ug via INTRAVENOUS
  Administered 2022-11-30: 50 ug via INTRAVENOUS

## 2022-11-30 MED ORDER — BUPIVACAINE HCL (PF) 0.5 % IJ SOLN
INTRAMUSCULAR | Status: AC
  Filled 2022-11-30: qty 30

## 2022-11-30 MED ORDER — FENTANYL CITRATE (PF) 100 MCG/2ML IJ SOLN: 25.0000 ug | INTRAMUSCULAR | Status: DC | PRN

## 2022-11-30 MED ORDER — DEXAMETHASONE SODIUM PHOSPHATE 10 MG/ML IJ SOLN
INTRAMUSCULAR | Status: AC
  Filled 2022-11-30: qty 1

## 2022-11-30 MED ORDER — ONDANSETRON HCL 4 MG/2ML IJ SOLN
INTRAMUSCULAR | Status: DC | PRN
  Administered 2022-11-30: 4 mg via INTRAVENOUS

## 2022-11-30 NOTE — Anesthesia Procedure Notes (Signed)
Procedure Name: Intubation Date/Time: 11/30/2022 12:29 AM  Performed by: Cyrah Mclamb T, CRNAPre-anesthesia Checklist: Patient identified, Emergency Drugs available, Suction available and Patient being monitored Patient Re-evaluated:Patient Re-evaluated prior to induction Oxygen Delivery Method: Circle system utilized Preoxygenation: Pre-oxygenation with 100% oxygen Induction Type: IV induction, Rapid sequence and Cricoid Pressure applied Ventilation: Mask ventilation without difficulty Laryngoscope Size: Miller and 2 Grade View: Grade I Tube type: Oral Tube size: 7.0 mm Number of attempts: 1 Airway Equipment and Method: Stylet and Oral airway Placement Confirmation: ETT inserted through vocal cords under direct vision, positive ETCO2 and breath sounds checked- equal and bilateral Secured at: 21 cm Tube secured with: Tape Dental Injury: Teeth and Oropharynx as per pre-operative assessment

## 2022-11-30 NOTE — Discharge Instructions (Addendum)

## 2022-11-30 NOTE — MAU Provider Note (Signed)
Please refer to H & P for admission details.    Samina Weekes, MD, FACOG Attending Obstetrician & Gynecologist Faculty Practice, [facility]   

## 2022-11-30 NOTE — Op Note (Signed)
Deborah Holland PROCEDURE DATE: 11/30/2022  PREOPERATIVE DIAGNOSIS: Ruptured ectopic pregnancy and hemoperitoneum POSTOPERATIVE DIAGNOSIS: Ruptured right fallopian tube ectopic pregnancy PROCEDURE: Laparoscopic right salpingectomy and removal of ectopic pregnancy SURGEON:  Dr. Jaynie Collins ANESTHESIOLOGY TEAM: Anesthesiologist: Shelton Silvas, MD CRNA: Reine Just, CRNA  INDICATIONS: 21 y.o. G1P0 here with the preoperative diagnoses as listed above.  Please refer to preoperative notes for more details. Patient was counseled regarding need for laparoscopic salpingectomy. Risks of surgery including bleeding which may require transfusion or reoperation, infection, injury to bowel or other surrounding organs, need for additional procedures including laparotomy and other postoperative/anesthesia complications were explained to patient.  Written informed consent was obtained.  FINDINGS:  Moderate amount of hemoperitoneum estimated to be about 500 ml of blood and clots.  Dilated right fallopian tube containing ectopic gestation. Small normal appearing uterus, normal left fallopian tube, left ovary and left ovary.  ANESTHESIA: General INTRAVENOUS FLUIDS: 1000 ml ESTIMATED BLOOD LOSS: 10 ml URINE OUTPUT: 150 ml SPECIMENS: Right fallopian tube containing ectopic gestation COMPLICATIONS: None immediate  PROCEDURE IN DETAIL:  The patient was taken to the operating room where general anesthesia was administered and was found to be adequate.  She was placed in the dorsal lithotomy position, and was prepped and draped in a sterile manner.  A Foley catheter was inserted into her bladder and attached to constant drainage and a uterine manipulator was then advanced into the uterus .    After an adequate timeout was performed, attention was turned to the abdomen where an umbilical incision was made with the scalpel.  The Optiview 11-mm trocar and sleeve were then advanced without difficulty with the  laparoscope under direct visualization into the abdomen.  The abdomen was then insufflated with carbon dioxide gas and adequate pneumoperitoneum was obtained.  A survey of the patient's pelvis and abdomen revealed the findings above.  Two 5-mm  left lower quadrant ports were then placed under direct visualization.  The Nezhat suction irrigator was then used to suction the hemoperitoneum and irrigate the pelvis.  Attention was then turned to the right fallopian tube which was grasped and ligated from the underlying mesosalpinx and uterine attachment using the Harmonic instrument.  Good hemostasis was noted.  The specimen was placed in an EndoCatch bag and removed from the abdomen intact.  The abdomen was desufflated, and all instruments were removed.  The fascial incision of the 11-mm site was reapproximated with a 0 Vicryl figure-of-eight stitch; and all skin incisions were closed with 4-0 Monocryl and Dermabond. The patient tolerated the procedure well.  Sponge, lap, and needle counts were correct times three.  The patient was then taken to the recovery room awake, extubated and in stable condition.   The patient will be discharged to home as per PACU criteria.  Routine postoperative instructions given.  She was prescribed Oxycodone, Ibuprofen and Colace.  She will follow up in the office in about 2-3 weeks for postoperative evaluation.   Jaynie Collins, MD, FACOG Obstetrician & Gynecologist, [facility] for [employer], [facility]

## 2022-11-30 NOTE — Anesthesia Preprocedure Evaluation (Signed)
Anesthesia Evaluation  Patient identified by MRN, date of birth, ID band Patient awake    Reviewed: Allergy & Precautions, NPO status , Patient's Chart, lab work & pertinent test results  Airway Mallampati: II  TM Distance: >3 FB Neck ROM: Full    Dental  (+) Teeth Intact, Dental Advisory Given   Pulmonary asthma , Patient abstained from smoking.   breath sounds clear to auscultation       Cardiovascular negative cardio ROS  Rhythm:Regular Rate:Normal     Neuro/Psych negative neurological ROS  negative psych ROS   GI/Hepatic negative GI ROS, Neg liver ROS,,,  Endo/Other  negative endocrine ROS    Renal/GU negative Renal ROS     Musculoskeletal negative musculoskeletal ROS (+)    Abdominal   Peds  Hematology negative hematology ROS (+)   Anesthesia Other Findings   Reproductive/Obstetrics                              Anesthesia Physical Anesthesia Plan  ASA: 2 and emergent  Anesthesia Plan: General   Post-op Pain Management: Ofirmev IV (intra-op)* and Toradol IV (intra-op)*   Induction: Intravenous  PONV Risk Score and Plan: 4 or greater and Ondansetron, Dexamethasone, Midazolam, Scopolamine patch - Pre-op and Treatment may vary due to age or medical condition  Airway Management Planned: Oral ETT  Additional Equipment: None  Intra-op Plan:   Post-operative Plan: Extubation in OR  Informed Consent: I have reviewed the patients History and Physical, chart, labs and discussed the procedure including the risks, benefits and alternatives for the proposed anesthesia with the patient or authorized representative who has indicated his/her understanding and acceptance.     Dental advisory given  Plan Discussed with: CRNA  Anesthesia Plan Comments:          Anesthesia Quick Evaluation

## 2022-11-30 NOTE — Transfer of Care (Signed)
Immediate Anesthesia Transfer of Care Note  Patient: Deborah Holland  Procedure(s) Performed: LAPAROSCOPIC RIGHT SALPINGECTOMY WITH REMOVAL OF ECTOPIC PREGNANCY (Right)  Patient Location: PACU  Anesthesia Type:General  Level of Consciousness: awake, alert , and oriented  Airway & Oxygen Therapy: Patient Spontanous Breathing and Patient connected to nasal cannula oxygen  Post-op Assessment: Report given to RN, Post -op Vital signs reviewed and stable, and Patient moving all extremities  Post vital signs: Reviewed and stable  Last Vitals:  Vitals Value Taken Time  BP 130/89 11/30/22 0148  Temp    Pulse 78 11/30/22 0155  Resp 18 11/30/22 0156  SpO2 100 % 11/30/22 0155  Vitals shown include unvalidated device data.  Last Pain:  Vitals:   11/29/22 2301  TempSrc:   PainSc: 10-Worst pain ever         Complications: No notable events documented.

## 2022-12-01 LAB — SURGICAL PATHOLOGY

## 2022-12-01 NOTE — Anesthesia Postprocedure Evaluation (Signed)
Anesthesia Post Note  Patient: Heike Pounds  Procedure(s) Performed: LAPAROSCOPIC RIGHT SALPINGECTOMY WITH REMOVAL OF ECTOPIC PREGNANCY (Right)     Patient location during evaluation: PACU Anesthesia Type: General Level of consciousness: awake and alert Pain management: pain level controlled Vital Signs Assessment: post-procedure vital signs reviewed and stable Respiratory status: spontaneous breathing, nonlabored ventilation, respiratory function stable and patient connected to nasal cannula oxygen Cardiovascular status: blood pressure returned to baseline and stable Postop Assessment: no apparent nausea or vomiting Anesthetic complications: no   No notable events documented.  Last Vitals:  Vitals:   11/30/22 0205 11/30/22 0220  BP: 131/73 117/75  Pulse: 67 64  Resp: 18 15  Temp:  36.6 C  SpO2: 100% 100%    Last Pain:  Vitals:   11/30/22 0220  TempSrc:   PainSc: 0-No pain                 Shelton Silvas

## 2022-12-17 NOTE — Progress Notes (Deleted)
   GYNECOLOGY OFFICE VISIT NOTE  History:   Deborah Holland is a 22 y.o. G1P0 here today for postop check s/p ruptured right ectopic on 12/19 s/p right salpingectomy and removal of ectopic pregnancy with Dr. Harolyn Rutherford.       Past Medical History:  Diagnosis Date   Asthma    Chlamydia infection 08/18/2022    Past Surgical History:  Procedure Laterality Date   LAPAROSCOPIC UNILATERAL SALPINGECTOMY Right 11/29/2022   Procedure: LAPAROSCOPIC RIGHT SALPINGECTOMY WITH REMOVAL OF ECTOPIC PREGNANCY;  Surgeon: Osborne Oman, MD;  Location: [place];  Service: Gynecology;  Laterality: Right;    The following portions of the patient's history were reviewed and updated as appropriate: allergies, current medications, past family history, past medical history, past social history, past surgical history and problem list.   Health Maintenance:   Normal pap and negative HRHPV on ***.   No results found for: "DIAGPAP"   Normal mammogram on ***.   Review of Systems:  Pertinent items noted in HPI and remainder of comprehensive ROS otherwise negative.  Physical Exam:  LMP 11/18/2022  CONSTITUTIONAL: Well-developed, well-nourished female in no acute distress.  HEENT:  Normocephalic, atraumatic. External right and left ear normal. No scleral icterus.  NECK: Normal range of motion, supple, no masses noted on observation SKIN: No rash noted. Not diaphoretic. No erythema. No pallor. MUSCULOSKELETAL: Normal range of motion. No edema noted. NEUROLOGIC: Alert and oriented to person, place, and time. Normal muscle tone coordination. No cranial nerve deficit noted. PSYCHIATRIC: Normal mood and affect. Normal behavior. Normal judgment and thought content.  ABDOMEN: No masses noted. No other overt distention noted.  Incisions inspected and c/d/I ***  PELVIC: Deferred  Labs and Imaging No results found for this or any previous visit (from the past 168 hour(s)).   Assessment and Plan:   1. Postop  check All restrictions lifted  2. Ruptured right tubal ectopic pregnancy causing hemoperitoneum Reviewed risk of recurrence  Discussed importance of continued early pregnancy follow up (she was [redacted]w[redacted]d) with + UPT For pregnancy plans she ***    Diagnoses and all orders for this visit:  Postop check  Ruptured right tubal ectopic pregnancy causing hemoperitoneum    Routine preventative health maintenance measures emphasized. Please refer to After Visit Summary for other counseling recommendations.   No follow-ups on file.  Radene Gunning, MD, [place] for [facility], [place]

## 2022-12-20 ENCOUNTER — Encounter: Payer: Self-pay | Admitting: Obstetrics and Gynecology

## 2022-12-20 DIAGNOSIS — K661 Hemoperitoneum: Secondary | ICD-10-CM

## 2022-12-20 DIAGNOSIS — Z09 Encounter for follow-up examination after completed treatment for conditions other than malignant neoplasm: Secondary | ICD-10-CM

## 2022-12-29 ENCOUNTER — Telehealth: Payer: Self-pay | Admitting: Family Medicine

## 2022-12-29 ENCOUNTER — Ambulatory Visit (INDEPENDENT_AMBULATORY_CARE_PROVIDER_SITE_OTHER): Payer: Self-pay | Admitting: Obstetrics and Gynecology

## 2022-12-29 ENCOUNTER — Encounter: Payer: Self-pay | Admitting: Obstetrics and Gynecology

## 2022-12-29 ENCOUNTER — Other Ambulatory Visit: Payer: Self-pay

## 2022-12-29 VITALS — BP 124/79 | HR 106 | Ht 63.0 in | Wt 110.2 lb

## 2022-12-29 DIAGNOSIS — Z4889 Encounter for other specified surgical aftercare: Secondary | ICD-10-CM

## 2022-12-29 DIAGNOSIS — Z1331 Encounter for screening for depression: Secondary | ICD-10-CM

## 2022-12-29 NOTE — Telephone Encounter (Signed)
At Check out patient refused appointment with Roselyn Reef, she have her own provider

## 2022-12-29 NOTE — Patient Instructions (Signed)
[facility]  [address], [place], [place] [address] [phone] or [phone] WALK-IN URGENT CARE 24/7 FOR ANYONE [address], [place], [place]  [phone] Fax: [phone] [email or web address] *Interpreters available *Accepts all insurance and uninsured for Urgent Care needs *Accepts Medicaid and uninsured for outpatient treatment (below)    ONLY FOR [place] Residents  Below:   Outpatient New Patient Assessment/Therapy Walk-ins:        Monday -Thursday 8am until slots are full.        Every Friday 1pm-4pm  (first come, first served)                   New Patient Psychiatry/Medication Management        Monday-Friday 8am-11am (first come, first served)              For all walk-ins we ask that you arrive by 7:15am, because patients will be seen in the order of arrival.     

## 2022-12-29 NOTE — Progress Notes (Signed)
    Subjective:    Deborah Holland is a 22 y.o. female who presents to the clinic status post laparoscopic salpingectomy on 11/30/22. The patient is not having any pain.  Eating a regular diet without difficulty. Bowel movements are normal. No other significant postoperative concerns.  The following portions of the patient's history were reviewed and updated as appropriate: allergies, current medications, past family history, past medical history, past social history, past surgical history, and problem list..  Last pap smear was ? Normal, results requested, on January 2024 at [facility].  Review of Systems Pertinent items are noted in HPI.   Objective:   BP 124/79   Pulse (!) 106   Ht 5\' 3"  (1.6 m)   Wt 110 lb 3.2 oz (50 kg)   LMP 11/18/2022 Comment: ectopic  Breastfeeding Unknown   BMI 19.52 kg/m  Constitutional:  Well-developed, well-nourished female in no acute distress.   Skin: Skin is warm and dry, no rash noted, not diaphoretic,no erythema, no pallor.  Cardiovascular: Normal heart rate noted  Respiratory: Effort and breath sounds normal, no problems with respiration noted  Abdomen: Soft, bowel sounds active, non-tender, no abnormal masses  Incision: Healing well, no drainage, no erythema, no hernia, no seroma, no swelling, no dehiscence, incision well approximated  Pelvic:   Deferred   Surgical pathology () Consistent with ectopic pregnancy Assessment:   Doing well postoperatively.  Operative findings again reviewed. Pathology report discussed.   Plan:   1. Continue any current medications. 2. Wound care discussed. 3. Activity restrictions: none 4. Anticipated return to work: now. 5. Follow up as needed 6.  Routine preventative health maintenance measures emphasized. Please refer to After Visit Summary for other counseling recommendations.    Lynnda Shields, MD, [place] Attending [place] for [facility], [place]

## 2022-12-29 NOTE — Progress Notes (Signed)
Phq-9: 26 GAD7: 21 2 for ? 9

## 2023-01-12 ENCOUNTER — Encounter: Payer: Self-pay | Admitting: *Deleted

## 2023-05-12 ENCOUNTER — Other Ambulatory Visit: Payer: Self-pay

## 2023-05-12 ENCOUNTER — Encounter (HOSPITAL_COMMUNITY): Payer: Self-pay | Admitting: *Deleted

## 2023-05-12 ENCOUNTER — Emergency Department (HOSPITAL_COMMUNITY): Payer: Self-pay

## 2023-05-12 ENCOUNTER — Emergency Department (HOSPITAL_COMMUNITY)
Admission: EM | Admit: 2023-05-12 | Discharge: 2023-05-12 | Disposition: A | Discharge status: Home / Self Care | Payer: Self-pay | Attending: Emergency Medicine | Admitting: Emergency Medicine

## 2023-05-12 DIAGNOSIS — R109 Unspecified abdominal pain: Secondary | ICD-10-CM | POA: Insufficient documentation

## 2023-05-12 DIAGNOSIS — N939 Abnormal uterine and vaginal bleeding, unspecified: Secondary | ICD-10-CM

## 2023-05-12 DIAGNOSIS — E876 Hypokalemia: Secondary | ICD-10-CM | POA: Insufficient documentation

## 2023-05-12 DIAGNOSIS — B9689 Other specified bacterial agents as the cause of diseases classified elsewhere: Secondary | ICD-10-CM | POA: Insufficient documentation

## 2023-05-12 DIAGNOSIS — N760 Acute vaginitis: Secondary | ICD-10-CM | POA: Insufficient documentation

## 2023-05-12 LAB — COMPREHENSIVE METABOLIC PANEL
ALT: 13 U/L (ref 0–44)
AST: 17 U/L (ref 15–41)
Albumin: 4.2 g/dL (ref 3.5–5.0)
Alkaline Phosphatase: 59 U/L (ref 38–126)
Anion gap: 9 (ref 5–15)
BUN: 10 mg/dL (ref 6–20)
CO2: 23 mmol/L (ref 22–32)
Calcium: 8.9 mg/dL (ref 8.9–10.3)
Chloride: 106 mmol/L (ref 98–111)
Creatinine, Ser: 0.65 mg/dL (ref 0.44–1.00)
GFR, Estimated: >60 mL/min (ref >60)
Glucose, Bld: 117 mg/dL — ABNORMAL HIGH (ref 70–99)
Potassium: 3.3 mmol/L — ABNORMAL LOW (ref 3.5–5.1)
Sodium: 138 mmol/L (ref 135–145)
Total Bilirubin: 0.6 mg/dL (ref 0.3–1.2)
Total Protein: 7.6 g/dL (ref 6.5–8.1)

## 2023-05-12 LAB — CBC
HCT: 38.8 % (ref 36.0–46.0)
Hemoglobin: 12.8 g/dL (ref 12.0–15.0)
MCH: 29.2 pg (ref 26.0–34.0)
MCHC: 33 g/dL (ref 30.0–36.0)
MCV: 88.4 fL (ref 80.0–100.0)
Platelets: 270 10*3/uL (ref 150–400)
RBC: 4.39 MIL/uL (ref 3.87–5.11)
RDW: 13.7 % (ref 11.5–15.5)
WBC: 12.1 10*3/uL — ABNORMAL HIGH (ref 4.0–10.5)
nRBC: 0 % (ref 0.0–0.2)

## 2023-05-12 LAB — WET PREP, GENITAL
Sperm: NONE SEEN
Trich, Wet Prep: NONE SEEN
WBC, Wet Prep HPF POC: <10 (ref <10)
Yeast Wet Prep HPF POC: NONE SEEN

## 2023-05-12 LAB — I-STAT BETA HCG BLOOD, ED (MC, WL, AP ONLY): I-stat hCG, quantitative: <5 m[IU]/mL (ref <5)

## 2023-05-12 MED ORDER — ONDANSETRON HCL 4 MG PO TABS: 4.0000 mg | ORAL_TABLET | Freq: Three times a day (TID) | ORAL | 0 refills | Status: AC | PRN

## 2023-05-12 MED ORDER — METRONIDAZOLE 500 MG PO TABS: 500.0000 mg | ORAL_TABLET | Freq: Two times a day (BID) | ORAL | 0 refills | Status: AC

## 2023-05-12 MED ORDER — POTASSIUM CHLORIDE CRYS ER 20 MEQ PO TBCR
40.0000 meq | EXTENDED_RELEASE_TABLET | Freq: Once | ORAL | Status: AC
  Administered 2023-05-12: 40 meq via ORAL
  Filled 2023-05-12: qty 2

## 2023-05-12 MED ORDER — METRONIDAZOLE 500 MG PO TABS
500.0000 mg | ORAL_TABLET | Freq: Once | ORAL | Status: AC
  Administered 2023-05-12: 500 mg via ORAL
  Filled 2023-05-12: qty 1

## 2023-05-12 NOTE — ED Triage Notes (Addendum)
Pt is here due to abnormal vaginal bleeding. Pt had a menstrual period 5/11 and this lasted the usual 3 days.  She then began spotting on 5/26  and the bleeding increased to almost like a regular period and she continues to have spotting at this time as well as cramping.  Pt has hx of tubal pregnancy December 2023. Pain comes and goes and is mild. It is on the right (same side where tube was removed)

## 2023-05-12 NOTE — Discharge Instructions (Signed)
Thank you for letting us take care of you today.  Your pregnancy test was negative. Your ultrasound was normal. It looks like you have bacterial vaginosis which we are prescribing antibiotics to treat. Follow up on gonorrhea and chlamydia swabs via MyChart. I also prescribed nausea medication for use as needed with antibiotic.   If you continue to have irregular periods or bleeding, follow up with your PCP or OB/GYN. Your potassium was slightly low.  We gave you medication to replace this in the ED.  Have this level rechecked by your PCP.  See attachments on how you can increase potassium in your diet.  For any new or worsening symptoms, please return to the nearest ED for reevaluation.

## 2023-05-12 NOTE — ED Provider Notes (Signed)
[place] EMERGENCY DEPARTMENT AT [facility] Provider Note   CSN: [id number] Arrival date & time: 05/12/23  1706     History  Chief Complaint  Patient presents with   Vaginal Bleeding    Deborah Holland is a 22 y.o. 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  She reports that she is having light bleeding using 1-2 pads or tampons per day for the last 4 days.  Her last menstrual cycle was on May 11 and was normal.  She has had regular cycles since her ectopic pregnancy.  She has mild associated right lower quadrant abdominal pain.  She states when she has her menses she typically has pain in this location and it feels very similar.  Currently pain-free.  No associated fever, chills, nausea, vomiting, diarrhea, dysuria, hematuria, chest pain, SOB, or vaginal discharge.  No history of anemia or requiring blood transfusions. No other previous abdominal surgeries. No new sexual partners.  Reports that she gets tested for STDs monthly and last was negative.      Home Medications Prior to Admission medications   Medication Sig Start Date End Date Taking? Authorizing Provider  metroNIDAZOLE (FLAGYL) 500 MG tablet Take 1 tablet (500 mg total) by mouth 2 (two) times daily for 7 days. 05/13/23 05/20/23 Yes Tyna Huertas L, PA-C  ondansetron (ZOFRAN) 4 MG tablet Take 1 tablet (4 mg total) by mouth every 8 (eight) hours as needed for up to 5 days for nausea or vomiting. 05/12/23 05/17/23 Yes Valena Ivanov L, PA-C  acetaminophen (TYLENOL) 500 MG tablet Take 500 mg by mouth every 6 (six) hours as needed.    [provider]  docusate sodium (COLACE) 100 MG capsule Take 1 capsule (100 mg total) by mouth 2 (two) times daily as needed for mild constipation or moderate constipation. Patient not taking: Reported on 12/29/2022 11/30/22   Tereso Newcomer, MD  ibuprofen (ADVIL) 600  MG tablet Take 1 tablet (600 mg total) by mouth every 6 (six) hours as needed for headache, mild pain, moderate pain or cramping. 11/30/22   Anyanwu, Jethro Bastos, MD  oxyCODONE (OXY IR/ROXICODONE) 5 MG immediate release tablet Take 1 tablet (5 mg total) by mouth every 4 (four) hours as needed for severe pain or breakthrough pain. Patient not taking: Reported on 12/29/2022 11/30/22   Tereso Newcomer, MD      Allergies    Other    Review of Systems   Review of Systems  All other systems reviewed and are negative.   Physical Exam Updated Vital Signs BP 108/60 (BP Location: Left Arm)   Pulse 66   Temp 98.5 F (36.9 C) (Oral)   Resp 18   LMP 04/23/2023   SpO2 100%  Physical Exam Vitals and nursing note reviewed.  Constitutional:      General: She is not in acute distress.    Appearance: Normal appearance. She is not ill-appearing or toxic-appearing.  HENT:     Head: Normocephalic and atraumatic.     Nose: Nose normal.     Mouth/Throat:     Mouth: Mucous membranes are moist.  Eyes:     General: No scleral icterus.    Extraocular Movements: Extraocular movements intact.     Conjunctiva/sclera: Conjunctivae normal.  Cardiovascular:     Rate and Rhythm: Normal rate and regular rhythm.  Pulmonary:     Effort: Pulmonary effort is normal.  Breath sounds: Normal breath sounds.  Abdominal:     General: Abdomen is flat. There is no distension.     Palpations: Abdomen is soft.     Tenderness: There is no abdominal tenderness. There is no right CVA tenderness, left CVA tenderness, guarding or rebound.  Genitourinary:    Comments: Declines pelvic exam Musculoskeletal:        General: Normal range of motion.     Cervical back: Normal range of motion and neck supple.  Skin:    General: Skin is warm and dry.     Coloration: Skin is not jaundiced or pale.     Findings: No rash.  Neurological:     General: No focal deficit present.     Mental Status: She is alert and oriented to  person, place, and time.  Psychiatric:        Mood and Affect: Mood normal.        Behavior: Behavior normal.     ED Results / Procedures / Treatments   Labs (all labs ordered are listed, but only abnormal results are displayed) Labs Reviewed  WET PREP, GENITAL - Abnormal; Notable for the following components:      Result Value   Clue Cells Wet Prep HPF POC PRESENT (*)    All other components within normal limits  CBC - Abnormal; Notable for the following components:   WBC 12.1 (*)    All other components within normal limits  COMPREHENSIVE METABOLIC PANEL - Abnormal; Notable for the following components:   Potassium 3.3 (*)    Glucose, Bld 117 (*)    All other components within normal limits  I-STAT BETA HCG BLOOD, ED ([facility], [facility], [facility] ONLY)  GC/CHLAMYDIA PROBE AMP ([place]) NOT AT [facility]    EKG None  Radiology US Pelvis Complete  Result Date: 05/12/2023 CLINICAL DATA:  Vaginal bleeding EXAM: TRANSABDOMINAL AND TRANSVAGINAL ULTRASOUND OF PELVIS DOPPLER ULTRASOUND OF OVARIES TECHNIQUE: Both transabdominal and transvaginal ultrasound examinations of the pelvis were performed. Transabdominal technique was performed for global imaging of the pelvis including uterus, ovaries, adnexal regions, and pelvic cul-de-sac. It was necessary to proceed with endovaginal exam following the transabdominal exam to visualize the uterus endometrium ovaries. Color and duplex Doppler ultrasound was utilized to evaluate blood flow to the ovaries. COMPARISON:  None Available. FINDINGS: Uterus Measurements: 6.8 x 3.1 x 5.1 cm = volume: 55.2 mL. No fibroids or other mass visualized. Endometrium Thickness: 3 mm.  No focal abnormality visualized. Right ovary Measurements: 2.3 x 2.1 x 2.8 cm = volume: 7.2 mL. Normal appearance/no adnexal mass. Left ovary Measurements: 2.6 x 1.8 x 2 cm = volume: 5.0 mL. Normal appearance/no adnexal mass. Pulsed Doppler evaluation of both ovaries demonstrates normal low-resistance  arterial and venous waveforms. Other findings No abnormal free fluid. IMPRESSION: Negative pelvic ultrasound. No evidence for ovarian torsion. Electronically Signed   By: Jasmine Pang M.D.   On: 05/12/2023 19:48   US Transvaginal Non-OB  Result Date: 05/12/2023 CLINICAL DATA:  Vaginal bleeding EXAM: TRANSABDOMINAL AND TRANSVAGINAL ULTRASOUND OF PELVIS DOPPLER ULTRASOUND OF OVARIES TECHNIQUE: Both transabdominal and transvaginal ultrasound examinations of the pelvis were performed. Transabdominal technique was performed for global imaging of the pelvis including uterus, ovaries, adnexal regions, and pelvic cul-de-sac. It was necessary to proceed with endovaginal exam following the transabdominal exam to visualize the uterus endometrium ovaries. Color and duplex Doppler ultrasound was utilized to evaluate blood flow to the ovaries. COMPARISON:  None Available. FINDINGS: Uterus Measurements: 6.8 x 3.1  x 5.1 cm = volume: 55.2 mL. No fibroids or other mass visualized. Endometrium Thickness: 3 mm.  No focal abnormality visualized. Right ovary Measurements: 2.3 x 2.1 x 2.8 cm = volume: 7.2 mL. Normal appearance/no adnexal mass. Left ovary Measurements: 2.6 x 1.8 x 2 cm = volume: 5.0 mL. Normal appearance/no adnexal mass. Pulsed Doppler evaluation of both ovaries demonstrates normal low-resistance arterial and venous waveforms. Other findings No abnormal free fluid. IMPRESSION: Negative pelvic ultrasound. No evidence for ovarian torsion. Electronically Signed   By: Jasmine Pang M.D.   On: 05/12/2023 19:48   [place] Art/Ven Flow Abd Pelv Doppler  Result Date: 05/12/2023 CLINICAL DATA:  Vaginal bleeding EXAM: TRANSABDOMINAL AND TRANSVAGINAL ULTRASOUND OF PELVIS DOPPLER ULTRASOUND OF OVARIES TECHNIQUE: Both transabdominal and transvaginal ultrasound examinations of the pelvis were performed. Transabdominal technique was performed for global imaging of the pelvis including uterus, ovaries, adnexal regions, and pelvic  cul-de-sac. It was necessary to proceed with endovaginal exam following the transabdominal exam to visualize the uterus endometrium ovaries. Color and duplex Doppler ultrasound was utilized to evaluate blood flow to the ovaries. COMPARISON:  None Available. FINDINGS: Uterus Measurements: 6.8 x 3.1 x 5.1 cm = volume: 55.2 mL. No fibroids or other mass visualized. Endometrium Thickness: 3 mm.  No focal abnormality visualized. Right ovary Measurements: 2.3 x 2.1 x 2.8 cm = volume: 7.2 mL. Normal appearance/no adnexal mass. Left ovary Measurements: 2.6 x 1.8 x 2 cm = volume: 5.0 mL. Normal appearance/no adnexal mass. Pulsed Doppler evaluation of both ovaries demonstrates normal low-resistance arterial and venous waveforms. Other findings No abnormal free fluid. IMPRESSION: Negative pelvic ultrasound. No evidence for ovarian torsion. Electronically Signed   By: Jasmine Pang M.D.   On: 05/12/2023 19:48    Procedures Procedures    Medications Ordered in ED Medications  potassium chloride SA (KLOR-CON M) CR tablet 40 mEq (40 mEq Oral Given 05/12/23 2048)  metroNIDAZOLE (FLAGYL) tablet 500 mg (500 mg Oral Given 05/12/23 2048)    ED Course/ Medical Decision Making/ A&P                             Medical Decision Making Amount and/or Complexity of Data Reviewed Labs: ordered. Decision-making details documented in ED Course. Radiology: ordered. Decision-making details documented in ED Course.  Risk Prescription drug management.   Medical Decision Making:   Azlynn Duellman is a 22 y.o. female who presented to the ED today with vaginal bleeding / abdominal pain detailed above.    Additional history discussed with patient's family/caregivers.  Patient's presentation is complicated by their history of ectopic pregnancy.  Complete initial physical exam performed, notably the patient  was in NAD. Abdomen soft, nontender, non-distended. No rebound, guarding, or peritoneal signs. No CVA tenderness.  Normal conjunctiva.    Reviewed and confirmed nursing documentation for past medical history, family history, social history.    Initial Assessment:   With the patient's presentation of vaginal bleeding / abdominal pain, differential diagnosis includes but is not limited to AAA, mesenteric ischemia, appendicitis, diverticulitis, DKA, gastritis, gastroenteritis, AMI, nephrolithiasis, pancreatitis, peritonitis, adrenal insufficiency, intestinal ischemia, constipation, UTI, SBO/LBO, splenic rupture, biliary disease, IBD, IBS, PUD, hepatitis, STD, ovarian/testicular torsion, electrolyte disturbance, DKA, dehydration, acute kidney injury, renal failure, cholecystitis, cholelithiasis, choledocholithiasis, abdominal pain of  unknown etiology, pregnancy, incomplete abortion, septic abortion, threatened abortion, ectopic pregnancy, PID.   Initial Plan:  Screening labs including CBC and Metabolic panel to evaluate for infectious or metabolic etiology  of disease.  Lipase to evaluate for pancreatitis Urinalysis with reflex culture ordered to evaluate for UTI or relevant urologic/nephrologic pathology. Unable to provide sample. No urinary symptoms.  CT abd/pelvis to evaluate for intra-abdominal pathology EKG to evaluate for cardiac pathology Symptomatic management Objective evaluation as reviewed   Initial Study Results:   Laboratory  All laboratory results reviewed without evidence of clinically relevant pathology.   Exceptions include: K3.3, WBC 12.1, wet prep positive for clue cells   Radiology:  All images reviewed independently. Agree with radiology report at this time.   US Pelvis Complete  Result Date: 05/12/2023 CLINICAL DATA:  Vaginal bleeding EXAM: TRANSABDOMINAL AND TRANSVAGINAL ULTRASOUND OF PELVIS DOPPLER ULTRASOUND OF OVARIES TECHNIQUE: Both transabdominal and transvaginal ultrasound examinations of the pelvis were performed. Transabdominal technique was performed for global imaging of the  pelvis including uterus, ovaries, adnexal regions, and pelvic cul-de-sac. It was necessary to proceed with endovaginal exam following the transabdominal exam to visualize the uterus endometrium ovaries. Color and duplex Doppler ultrasound was utilized to evaluate blood flow to the ovaries. COMPARISON:  None Available. FINDINGS: Uterus Measurements: 6.8 x 3.1 x 5.1 cm = volume: 55.2 mL. No fibroids or other mass visualized. Endometrium Thickness: 3 mm.  No focal abnormality visualized. Right ovary Measurements: 2.3 x 2.1 x 2.8 cm = volume: 7.2 mL. Normal appearance/no adnexal mass. Left ovary Measurements: 2.6 x 1.8 x 2 cm = volume: 5.0 mL. Normal appearance/no adnexal mass. Pulsed Doppler evaluation of both ovaries demonstrates normal low-resistance arterial and venous waveforms. Other findings No abnormal free fluid. IMPRESSION: Negative pelvic ultrasound. No evidence for ovarian torsion. Electronically Signed   By: Jasmine Pang M.D.   On: 05/12/2023 19:48   US Transvaginal Non-OB  Result Date: 05/12/2023 CLINICAL DATA:  Vaginal bleeding EXAM: TRANSABDOMINAL AND TRANSVAGINAL ULTRASOUND OF PELVIS DOPPLER ULTRASOUND OF OVARIES TECHNIQUE: Both transabdominal and transvaginal ultrasound examinations of the pelvis were performed. Transabdominal technique was performed for global imaging of the pelvis including uterus, ovaries, adnexal regions, and pelvic cul-de-sac. It was necessary to proceed with endovaginal exam following the transabdominal exam to visualize the uterus endometrium ovaries. Color and duplex Doppler ultrasound was utilized to evaluate blood flow to the ovaries. COMPARISON:  None Available. FINDINGS: Uterus Measurements: 6.8 x 3.1 x 5.1 cm = volume: 55.2 mL. No fibroids or other mass visualized. Endometrium Thickness: 3 mm.  No focal abnormality visualized. Right ovary Measurements: 2.3 x 2.1 x 2.8 cm = volume: 7.2 mL. Normal appearance/no adnexal mass. Left ovary Measurements: 2.6 x 1.8 x 2 cm =  volume: 5.0 mL. Normal appearance/no adnexal mass. Pulsed Doppler evaluation of both ovaries demonstrates normal low-resistance arterial and venous waveforms. Other findings No abnormal free fluid. IMPRESSION: Negative pelvic ultrasound. No evidence for ovarian torsion. Electronically Signed   By: Jasmine Pang M.D.   On: 05/12/2023 19:48   [place] Art/Ven Flow Abd Pelv Doppler  Result Date: 05/12/2023 CLINICAL DATA:  Vaginal bleeding EXAM: TRANSABDOMINAL AND TRANSVAGINAL ULTRASOUND OF PELVIS DOPPLER ULTRASOUND OF OVARIES TECHNIQUE: Both transabdominal and transvaginal ultrasound examinations of the pelvis were performed. Transabdominal technique was performed for global imaging of the pelvis including uterus, ovaries, adnexal regions, and pelvic cul-de-sac. It was necessary to proceed with endovaginal exam following the transabdominal exam to visualize the uterus endometrium ovaries. Color and duplex Doppler ultrasound was utilized to evaluate blood flow to the ovaries. COMPARISON:  None Available. FINDINGS: Uterus Measurements: 6.8 x 3.1 x 5.1 cm = volume: 55.2 mL. No fibroids or other mass visualized.  Endometrium Thickness: 3 mm.  No focal abnormality visualized. Right ovary Measurements: 2.3 x 2.1 x 2.8 cm = volume: 7.2 mL. Normal appearance/no adnexal mass. Left ovary Measurements: 2.6 x 1.8 x 2 cm = volume: 5.0 mL. Normal appearance/no adnexal mass. Pulsed Doppler evaluation of both ovaries demonstrates normal low-resistance arterial and venous waveforms. Other findings No abnormal free fluid. IMPRESSION: Negative pelvic ultrasound. No evidence for ovarian torsion. Electronically Signed   By: Jasmine Pang M.D.   On: 05/12/2023 19:48    Final Assessment and Plan:   22 year old female presents to the ED for evaluation of abnormal uterine bleeding.  Denies any vaginal trauma.  Is sexually active but no new partners.  No vaginal discharge or fever.  Some associated right-sided abdominal pain but states this is  typical when she has her menses.  Last menstrual period was earlier this month however and was normal.  Typically has regular periods.  Abdomen soft, nontender, nondistended.  No CVA tenderness.  No rebound, guarding, or peritoneal signs.  Patient nontoxic-appearing.  Low suspicion for STD/PID as patient states she gets monthly and was negative at last visit.  She declines pelvic exam.  Is agreeable to the ultrasound.  Is agreeable to self swab for wet prep and GC.  Minimal leukocytosis.  Minimal hypokalemia which was repleted.  No vomiting or diarrhea.  Tolerating p.o. without difficulty.  Normal hemoglobin.  Patient reports light bleeding only.  Wet prep positive for clue cells.  History of bacterial vaginosis but not in the last several months.  Discussed with patient all findings and will treat for bacterial vaginosis.  First dose of antibiotic given in the ED.  Patient agreeable with plan.  She will follow-up closely with primary care and/or OB/GYN as needed.  Strict ED return precautions given, all questions answered, and stable for discharge.   Clinical Impression:  1. Bacterial vaginosis   2. Vaginal bleeding   3. Hypokalemia      Discharge           Final Clinical Impression(s) / ED Diagnoses Final diagnoses:  Bacterial vaginosis  Vaginal bleeding  Hypokalemia    Rx / DC Orders ED Discharge Orders          Ordered    metroNIDAZOLE (FLAGYL) 500 MG tablet  2 times daily        05/12/23 2035    ondansetron (ZOFRAN) 4 MG tablet  Every 8 hours PRN        05/12/23 2035              Richardson Dopp 05/12/23 2156    Vanetta Mulders, MD 05/14/23 [phone]

## 2023-05-13 NOTE — ED Notes (Signed)
Lab called and stated patients GC Pelvic swab punctured a hole in the top so the specimen leaked out and was unable to be ran. Lorin, PA today stated to call the patient back and let her know about this situation and she can come back in to get the swab done again or [facility] testing.

## 2023-09-18 ENCOUNTER — Ambulatory Visit
Admission: EM | Admit: 2023-09-18 | Discharge: 2023-09-18 | Disposition: A | Discharge status: Home / Self Care | Payer: Self-pay | Attending: Internal Medicine | Admitting: Internal Medicine

## 2023-09-18 DIAGNOSIS — J029 Acute pharyngitis, unspecified: Secondary | ICD-10-CM

## 2023-09-18 DIAGNOSIS — J069 Acute upper respiratory infection, unspecified: Secondary | ICD-10-CM

## 2023-09-18 DIAGNOSIS — H6592 Unspecified nonsuppurative otitis media, left ear: Secondary | ICD-10-CM

## 2023-09-18 LAB — POCT RAPID STREP A (OFFICE): Rapid Strep A Screen: NEGATIVE

## 2023-09-18 MED ORDER — AMOXICILLIN-POT CLAVULANATE 875-125 MG PO TABS: 1.0000 | ORAL_TABLET | Freq: Two times a day (BID) | ORAL | 0 refills | Status: DC

## 2023-09-18 MED ORDER — PREDNISONE 20 MG PO TABS: 40.0000 mg | ORAL_TABLET | Freq: Every day | ORAL | 0 refills | Status: AC

## 2023-09-18 NOTE — ED Provider Notes (Signed)
[facility]    CSN: [id number] Arrival date & time: 09/18/23  1406      History   Chief Complaint No chief complaint on file.   HPI Deborah Holland is a 22 y.o. female.   Patient presents with approximately 1 week history of left ear pain, sore throat, headache, nasal congestion.  Denies any significant coughing, fever, known sick contacts.  Patient has taken over-the-counter pain reliever that she is not sure the name of with no improvement of symptoms.     Past Medical History:  Diagnosis Date   Asthma    Chlamydia infection 08/18/2022    Patient Active Problem List   Diagnosis Date Noted   Ruptured tubal ectopic pregnancy causing hemoperitoneum 11/29/2022    Past Surgical History:  Procedure Laterality Date   LAPAROSCOPIC UNILATERAL SALPINGECTOMY Right 11/29/2022   Procedure: LAPAROSCOPIC RIGHT SALPINGECTOMY WITH REMOVAL OF ECTOPIC PREGNANCY;  Surgeon: Tereso Newcomer, MD;  Location: [facility] OR;  Service: Gynecology;  Laterality: Right;    OB History     Gravida  1   Para      Term      Preterm      AB      Living         SAB      IAB      Ectopic      Multiple      Live Births               Home Medications    Prior to Admission medications   Medication Sig Start Date End Date Taking? Authorizing Provider  amoxicillin-clavulanate (AUGMENTIN) 875-125 MG tablet Take 1 tablet by mouth every 12 (twelve) hours. 09/18/23  Yes Nihal Doan, Rolly Salter E, FNP  predniSONE (DELTASONE) 20 MG tablet Take 2 tablets (40 mg total) by mouth daily for 5 days. 09/18/23 09/23/23 Yes Lavelle Akel, Acie Fredrickson, FNP  acetaminophen (TYLENOL) 500 MG tablet Take 500 mg by mouth every 6 (six) hours as needed.    [provider]  docusate sodium (COLACE) 100 MG capsule Take 1 capsule (100 mg total) by mouth 2 (two) times daily as needed for mild constipation or moderate constipation. Patient not taking: Reported on 12/29/2022 11/30/22   Tereso Newcomer, MD   ibuprofen (ADVIL) 600 MG tablet Take 1 tablet (600 mg total) by mouth every 6 (six) hours as needed for headache, mild pain, moderate pain or cramping. 11/30/22   Anyanwu, Jethro Bastos, MD  oxyCODONE (OXY IR/ROXICODONE) 5 MG immediate release tablet Take 1 tablet (5 mg total) by mouth every 4 (four) hours as needed for severe pain or breakthrough pain. Patient not taking: Reported on 12/29/2022 11/30/22   Tereso Newcomer, MD    Family History No family history on file.  Social History Social History   Tobacco Use   Smoking status: Never   Smokeless tobacco: Never  Vaping Use   Vaping status: Never Used  Substance Use Topics   Alcohol use: No   Drug use: Yes    Types: Marijuana     Allergies   Other   Review of Systems Review of Systems Per HPI  Physical Exam Triage Vital Signs ED Triage Vitals  Encounter Vitals Group     BP 09/18/23 1506 128/80     Systolic BP Percentile --      Diastolic BP Percentile --      Pulse Rate 09/18/23 1506 68     Resp --  Temp 09/18/23 1506 99.4 F (37.4 C)     Temp Source 09/18/23 1506 Oral     SpO2 09/18/23 1506 100 %     Weight 09/18/23 1509 119 lb (54 kg)     Height 09/18/23 1509 5\' 2"  (1.575 m)     Head Circumference --      Peak Flow --      Pain Score 09/18/23 1509 7     Pain Loc --      Pain Education --      Exclude from Growth Chart --    No data found.  Updated Vital Signs BP 128/80 (BP Location: Left Arm)   Pulse 68   Temp 99.4 F (37.4 C) (Oral)   Ht 5\' 2"  (1.575 m)   Wt 119 lb (54 kg)   LMP 08/24/2023 (Exact Date)   SpO2 100%   BMI 21.77 kg/m   Visual Acuity Right Eye Distance:   Left Eye Distance:   Bilateral Distance:    Right Eye Near:   Left Eye Near:    Bilateral Near:     Physical Exam Constitutional:      General: She is not in acute distress.    Appearance: Normal appearance. She is not toxic-appearing or diaphoretic.  HENT:     Head: Normocephalic and atraumatic.     Right Ear:  Tympanic membrane and ear canal normal.     Left Ear: Ear canal normal. A middle ear effusion is present. Tympanic membrane is bulging. Tympanic membrane is not perforated, erythematous or retracted.     Nose: Congestion present.     Mouth/Throat:     Mouth: Mucous membranes are moist.     Pharynx: Posterior oropharyngeal erythema present.  Eyes:     Extraocular Movements: Extraocular movements intact.     Conjunctiva/sclera: Conjunctivae normal.     Pupils: Pupils are equal, round, and reactive to light.  Cardiovascular:     Rate and Rhythm: Normal rate and regular rhythm.     Pulses: Normal pulses.     Heart sounds: Normal heart sounds.  Pulmonary:     Effort: Pulmonary effort is normal. No respiratory distress.     Breath sounds: Normal breath sounds. No wheezing.  Abdominal:     General: Abdomen is flat. Bowel sounds are normal.     Palpations: Abdomen is soft.  Musculoskeletal:        General: Normal range of motion.     Cervical back: Normal range of motion.  Skin:    General: Skin is warm and dry.  Neurological:     General: No focal deficit present.     Mental Status: She is alert and oriented to person, place, and time. Mental status is at baseline.  Psychiatric:        Mood and Affect: Mood normal.        Behavior: Behavior normal.      UC Treatments / Results  Labs (all labs ordered are listed, but only abnormal results are displayed) Labs Reviewed  CULTURE, GROUP A STREP [facility])  POCT RAPID STREP A (OFFICE)    EKG   Radiology No results found.  Procedures Procedures (including critical care time)  Medications Ordered in UC Medications - No data to display  Initial Impression / Assessment and Plan / UC Course  I have reviewed the triage vital signs and the nursing notes.  Pertinent labs & imaging results that were available during my care of the patient were reviewed by  me and considered in my medical decision making (see chart for details).      Patient has acute upper respiratory infection with fluid behind TM.  Rapid strep is negative.  Throat culture pending.  Viral testing deferred given duration of symptoms as it would not change treatment.  Will treat with Augmentin antibiotic given duration of symptoms and prednisone to decrease inflammation associated with fluid behind TM.  Advised her to follow-up if any symptoms persist or worsen.  Patient verbalized understanding and was agreeable with plan. Final Clinical Impressions(s) / UC Diagnoses   Final diagnoses:  Sore throat  Acute upper respiratory infection  Fluid level behind tympanic membrane of left ear     Discharge Instructions      Your strep test was negative.  Throat culture is pending.  Will call if it is positive.  I have prescribed you an antibiotic to treat upper respiratory infection and prednisone to decrease fluid behind your eardrum.  Follow-up if any symptoms persist or worsen.     ED Prescriptions     Medication Sig Dispense Auth. Provider   amoxicillin-clavulanate (AUGMENTIN) 875-125 MG tablet Take 1 tablet by mouth every 12 (twelve) hours. 14 tablet [place], [place], [place]   predniSONE (DELTASONE) 20 MG tablet Take 2 tablets (40 mg total) by mouth daily for 5 days. 10 tablet Gustavus Bryant, [place]      PDMP not reviewed this encounter.   Gustavus Bryant, [place] 09/18/23 [phone]

## 2023-09-18 NOTE — Discharge Instructions (Signed)
Your strep test was negative.  Throat culture is pending.  Will call if it is positive.  I have prescribed you an antibiotic to treat upper respiratory infection and prednisone to decrease fluid behind your eardrum.  Follow-up if any symptoms persist or worsen.

## 2023-09-18 NOTE — ED Triage Notes (Signed)
Patient presents with sore throat on left side, left ear pain and a migraine, causing dizziness. Sx have been present x 1 week. Treated with OTC pain reliever and pouring peroxide in ear.

## 2023-09-21 LAB — CULTURE, GROUP A STREP (THRC)

## 2023-09-26 ENCOUNTER — Encounter (HOSPITAL_COMMUNITY): Payer: Self-pay

## 2023-09-26 ENCOUNTER — Emergency Department (HOSPITAL_COMMUNITY)
Admission: EM | Admit: 2023-09-26 | Discharge: 2023-09-26 | Disposition: A | Discharge status: Home / Self Care | Payer: Self-pay | Attending: Emergency Medicine | Admitting: Emergency Medicine

## 2023-09-26 ENCOUNTER — Other Ambulatory Visit: Payer: Self-pay

## 2023-09-26 DIAGNOSIS — M25551 Pain in right hip: Secondary | ICD-10-CM | POA: Insufficient documentation

## 2023-09-26 DIAGNOSIS — J45909 Unspecified asthma, uncomplicated: Secondary | ICD-10-CM | POA: Insufficient documentation

## 2023-09-26 NOTE — Discharge Instructions (Signed)
It was a pleasure taking part in your care today.  As we discussed, please take daily walks.  Please treat pain in right hip with Motrin every 6 hours.  Please ice your right hip.  Follow-up with PCP for further management and care.  Return to the ED with any new or worsening signs or symptoms.

## 2023-09-26 NOTE — ED Provider Notes (Signed)
[place] [facility] AT [facility] Provider Note   CSN: [id number] Arrival date & time: 09/26/23  1831     History  Chief Complaint  Patient presents with   Hip Pain    Deborah Holland is a 22 y.o. female with medical history of asthma.  She presents to the ED for evaluation of right hip pain.  She states that she was diagnosed with a URI at a urgent care about 5 days ago.  States that as result of her decreased energy she has been lying around at home.  States that today she noted some right hip pain, took some Motrin and it got better.  She reports that she is concerned that her right hip is hurting.  She denies any trauma to her head.  Denies a history of right hip pain or arthroplasty of her right hip.  Denies any decreasing to motion her hip or overlying skin change.  States that currently her pain is 1 out of 10.   Hip Pain       Home Medications Prior to Admission medications   Medication Sig Start Date End Date Taking? Authorizing Provider  acetaminophen (TYLENOL) 500 MG tablet Take 500 mg by mouth every 6 (six) hours as needed.    [provider]  amoxicillin-clavulanate (AUGMENTIN) 875-125 MG tablet Take 1 tablet by mouth every 12 (twelve) hours. 09/18/23   Gustavus Bryant, FNP  docusate sodium (COLACE) 100 MG capsule Take 1 capsule (100 mg total) by mouth 2 (two) times daily as needed for mild constipation or moderate constipation. Patient not taking: Reported on 12/29/2022 11/30/22   Tereso Newcomer, MD  ibuprofen (ADVIL) 600 MG tablet Take 1 tablet (600 mg total) by mouth every 6 (six) hours as needed for headache, mild pain, moderate pain or cramping. 11/30/22   Anyanwu, Jethro Bastos, MD  oxyCODONE (OXY IR/ROXICODONE) 5 MG immediate release tablet Take 1 tablet (5 mg total) by mouth every 4 (four) hours as needed for severe pain or breakthrough pain. Patient not taking: Reported on 12/29/2022 11/30/22   Tereso Newcomer, MD       Allergies    Other    Review of Systems   Review of Systems  Musculoskeletal:  Positive for arthralgias.  All other systems reviewed and are negative.   Physical Exam Updated Vital Signs BP 122/74 (BP Location: Left Arm)   Pulse (!) 108   Temp 98.6 F (37 C) (Oral)   Resp 16   Ht 5\' 2"  (1.575 m)   Wt 53.9 kg   LMP 08/24/2023 (Exact Date)   SpO2 98%   BMI 21.73 kg/m  Physical Exam Vitals and nursing note reviewed.  Constitutional:      General: She is not in acute distress.    Appearance: Normal appearance. She is not ill-appearing, toxic-appearing or diaphoretic.  HENT:     Head: Normocephalic and atraumatic.     Nose: Nose normal.     Mouth/Throat:     Mouth: Mucous membranes are moist.     Pharynx: Oropharynx is clear.  Eyes:     Extraocular Movements: Extraocular movements intact.     Conjunctiva/sclera: Conjunctivae normal.     Pupils: Pupils are equal, round, and reactive to light.  Cardiovascular:     Rate and Rhythm: Normal rate and regular rhythm.  Pulmonary:     Effort: Pulmonary effort is normal.     Breath sounds: Normal breath sounds. No wheezing.  Abdominal:  General: Abdomen is flat.     Tenderness: There is no abdominal tenderness.  Musculoskeletal:     Cervical back: Normal range of motion and neck supple. No tenderness.     Right hip: Normal. No deformity, lacerations or tenderness. Normal range of motion.     Comments: Patient right hip with no shortening rotation.  Full range of motion of right hip.  No tenderness noted.  Skin:    General: Skin is warm and dry.     Capillary Refill: Capillary refill takes less than 2 seconds.  Neurological:     Mental Status: She is alert and oriented to person, place, and time.     ED Results / Procedures / Treatments   Labs (all labs ordered are listed, but only abnormal results are displayed) Labs Reviewed - No data to display  EKG None  Radiology No results  found.  Procedures Procedures   Medications Ordered in ED Medications - No data to display  ED Course/ Medical Decision Making/ A&P   Medical Decision Making  22 year old female presents to the ED for evaluation of right hip pain.  Please see HPI for further details.  On examination patient has no obvious deformity, shortening or rotation.  She has full range of motion of her right hip.  There is no overlying skin change or tenderness noted on palpation.  She reports that she took Motrin earlier in the right knee pain is decreasing immediately to 1 out of 10.  Had shared decision-making conversation with the patient.  Discussed getting x-ray imaging with the patient but she is deferring on this at this time.  States that she was just not sure why right hip is hurting but she also states that she has been lying on her right hip for the last 5 days and not really moving around as much.  Advised patient to take daily walks, treat with Motrin and ice.  Have advised her to follow-up with her PCP for further management care.  Encouraged her to return to the ED with any new or worsening symptoms.  She voiced understanding.  Stable to discharge.   Final Clinical Impression(s) / ED Diagnoses Final diagnoses:  Right hip pain    Rx / DC Orders ED Discharge Orders     None         Clent Ridges 09/26/23 2018    Charlynne Pander, MD 09/26/23 [phone]

## 2023-09-26 NOTE — ED Triage Notes (Signed)
Pt states her right hip is hurting, she has a respiratory infection diagnosed by UC and states she has been laying around a lot this week and she thinks it is from that.
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
## 2024-03-20 ENCOUNTER — Ambulatory Visit
Admission: EM | Admit: 2024-03-20 | Discharge: 2024-03-20 | Disposition: A | Discharge status: Home / Self Care | Attending: Family Medicine | Admitting: Family Medicine

## 2024-03-20 DIAGNOSIS — J069 Acute upper respiratory infection, unspecified: Secondary | ICD-10-CM

## 2024-03-20 DIAGNOSIS — H6012 Cellulitis of left external ear: Secondary | ICD-10-CM | POA: Diagnosis not present

## 2024-03-20 DIAGNOSIS — H9192 Unspecified hearing loss, left ear: Secondary | ICD-10-CM

## 2024-03-20 LAB — POC SARS CORONAVIRUS 2 AG -  ED: SARS Coronavirus 2 Ag: NEGATIVE

## 2024-03-20 MED ORDER — BENZONATATE 100 MG PO CAPS: 100.0000 mg | ORAL_CAPSULE | Freq: Three times a day (TID) | ORAL | 0 refills | Status: AC | PRN

## 2024-03-20 MED ORDER — CEPHALEXIN 250 MG PO CAPS: 250.0000 mg | ORAL_CAPSULE | Freq: Three times a day (TID) | ORAL | 0 refills | Status: AC

## 2024-03-20 MED ORDER — IBUPROFEN 600 MG PO TABS: 600.0000 mg | ORAL_TABLET | Freq: Three times a day (TID) | ORAL | 0 refills | Status: DC | PRN

## 2024-03-20 NOTE — ED Provider Notes (Addendum)
 [facility]    CSN: [id number] Arrival date & time: 03/20/24  1125      History   Chief Complaint Chief Complaint  Patient presents with   Ear Problem    HPI Deborah Holland is a 23 y.o. female.   HPI Here for pain in her left ear, began really this AM. Has had muffled hearing in that ear for a few months. A lot of pressure at present. Also began having cough, nasal congestion this AM. Subjective fever. No v/d.  LMP 3/24.  Only allergy is varicella vaccine.   Past Medical History:  Diagnosis Date   Asthma    Chlamydia infection 08/18/2022    Patient Active Problem List   Diagnosis Date Noted   Ruptured tubal ectopic pregnancy causing hemoperitoneum 11/29/2022    Past Surgical History:  Procedure Laterality Date   LAPAROSCOPIC UNILATERAL SALPINGECTOMY Right 11/29/2022   Procedure: LAPAROSCOPIC RIGHT SALPINGECTOMY WITH REMOVAL OF ECTOPIC PREGNANCY;  Surgeon: Tereso Newcomer, MD;  Location: [facility] OR;  Service: Gynecology;  Laterality: Right;    OB History     Gravida  1   Para      Term      Preterm      AB      Living         SAB      IAB      Ectopic      Multiple      Live Births               Home Medications    Prior to Admission medications   Medication Sig Start Date End Date Taking? Authorizing Provider  benzonatate (TESSALON) 100 MG capsule Take 1 capsule (100 mg total) by mouth 3 (three) times daily as needed for cough. 03/20/24  Yes Romero Letizia, Janace Aris, MD  cephALEXin (KEFLEX) 250 MG capsule Take 1 capsule (250 mg total) by mouth 3 (three) times daily for 7 days. 03/20/24 03/27/24 Yes Zenia Resides, MD  ibuprofen (ADVIL) 600 MG tablet Take 1 tablet (600 mg total) by mouth every 8 (eight) hours as needed (pain). 03/20/24  Yes Zenia Resides, MD  acetaminophen (TYLENOL) 500 MG tablet Take 500 mg by mouth every 6 (six) hours as needed.    [provider]  docusate sodium (COLACE) 100 MG capsule Take 1  capsule (100 mg total) by mouth 2 (two) times daily as needed for mild constipation or moderate constipation. Patient not taking: Reported on 12/29/2022 11/30/22   Tereso Newcomer, MD    Family History History reviewed. No pertinent family history.  Social History Social History   Tobacco Use   Smoking status: Never   Smokeless tobacco: Never  Vaping Use   Vaping status: Never Used  Substance Use Topics   Alcohol use: Yes    Comment: Occassionally.   Drug use: Yes    Types: Marijuana     Allergies   Other   Review of Systems Review of Systems   Physical Exam Triage Vital Signs ED Triage Vitals  Encounter Vitals Group     BP 03/20/24 1156 96/68     Systolic BP Percentile --      Diastolic BP Percentile --      Pulse Rate 03/20/24 1156 69     Resp 03/20/24 1156 16     Temp 03/20/24 1156 97.9 F (36.6 C)     Temp Source 03/20/24 1156 Oral     SpO2 03/20/24 1156 98 %  Weight 03/20/24 1152 113 lb (51.3 kg)     Height 03/20/24 1152 5\' 3"  (1.6 m)     Head Circumference --      Peak Flow --      Pain Score 03/20/24 1152 7     Pain Loc --      Pain Education --      Exclude from Growth Chart --    No data found.  Updated Vital Signs BP 96/68 (BP Location: Left Arm)   Pulse 69   Temp 97.9 F (36.6 C) (Oral)   Resp 16   Ht 5\' 3"  (1.6 m)   Wt 51.3 kg   LMP 03/05/2024 (Exact Date)   SpO2 98%   BMI 20.02 kg/m   Visual Acuity Right Eye Distance:   Left Eye Distance:   Bilateral Distance:    Right Eye Near:   Left Eye Near:    Bilateral Near:     Physical Exam Vitals reviewed.  Constitutional:      General: She is not in acute distress.    Appearance: She is not toxic-appearing.  HENT:     Right Ear: Tympanic membrane and ear canal normal.     Left Ear: Tympanic membrane normal.     Ears:     Comments: The TM is gray and shiny today. No swelling evident in canal. There is a little swelling and redness in the floor of the external ear at the  entrance to canal.      Nose: Nose normal.     Mouth/Throat:     Mouth: Mucous membranes are moist.     Pharynx: No oropharyngeal exudate or posterior oropharyngeal erythema.  Eyes:     Extraocular Movements: Extraocular movements intact.     Conjunctiva/sclera: Conjunctivae normal.     Pupils: Pupils are equal, round, and reactive to light.  Cardiovascular:     Rate and Rhythm: Normal rate and regular rhythm.     Heart sounds: No murmur heard. Pulmonary:     Effort: Pulmonary effort is normal. No respiratory distress.     Breath sounds: No stridor. No wheezing, rhonchi or rales.  Musculoskeletal:     Cervical back: Neck supple.  Lymphadenopathy:     Cervical: No cervical adenopathy.  Skin:    Capillary Refill: Capillary refill takes less than 2 seconds.     Coloration: Skin is not jaundiced or pale.  Neurological:     General: No focal deficit present.     Mental Status: She is alert and oriented to person, place, and time.  Psychiatric:        Behavior: Behavior normal.      UC Treatments / Results  Labs (all labs ordered are listed, but only abnormal results are displayed) Labs Reviewed  POC SARS CORONAVIRUS 2 AG -  ED - Normal    EKG   Radiology No results found.  Procedures Procedures (including critical care time)  Medications Ordered in UC Medications - No data to display  Initial Impression / Assessment and Plan / UC Course  I have reviewed the triage vital signs and the nursing notes.  Pertinent labs & imaging results that were available during my care of the patient were reviewed by me and considered in my medical decision making (see chart for details).     COVID swab is negative. Keflex is sent in for cellulitis in pinna.  I have asked her to f/u with PCP.  Ibuprofen sent in for pain Final Clinical  Impressions(s) / UC Diagnoses   Final diagnoses:  Viral URI  Cellulitis of left pinna  Decreased hearing, left     Discharge  Instructions      Your COVID test was negative.   Take cephalexin 250 mg--1 capsule 3 times daily for 7 days  Take benzonatate 100 mg, 1 tab every 8 hours as needed for cough.  Take ibuprofen 600 mg--1 tab every 8 hours as needed for pain.  Please followup with your primary care about your hearing/ear.       ED Prescriptions     Medication Sig Dispense Auth. Provider   cephALEXin (KEFLEX) 250 MG capsule Take 1 capsule (250 mg total) by mouth 3 (three) times daily for 7 days. 21 capsule Zenia Resides, MD   benzonatate (TESSALON) 100 MG capsule Take 1 capsule (100 mg total) by mouth 3 (three) times daily as needed for cough. 21 capsule Zenia Resides, MD   ibuprofen (ADVIL) 600 MG tablet Take 1 tablet (600 mg total) by mouth every 8 (eight) hours as needed (pain). 15 tablet Willies Laviolette, Janace Aris, MD      PDMP not reviewed this encounter.   Zenia Resides, MD 03/20/24 1435    Zenia Resides, MD 03/20/24 [id number]

## 2024-03-20 NOTE — Discharge Instructions (Addendum)
 Your COVID test was negative.   Take cephalexin 250 mg--1 capsule 3 times daily for 7 days  Take benzonatate 100 mg, 1 tab every 8 hours as needed for cough.  Take ibuprofen 600 mg--1 tab every 8 hours as needed for pain.  Please followup with your primary care about your hearing/ear.

## 2024-03-20 NOTE — ED Triage Notes (Signed)
"  This started as possibly allergies but now having a lot of pressure/pain in my left ear". "The pain is going down my jaw/neck now". No fever. "Also, their was a bump in my ear and maybe this is related".

## 2024-06-11 ENCOUNTER — Ambulatory Visit: Payer: Self-pay | Admitting: Nurse Practitioner

## 2024-10-11 ENCOUNTER — Encounter: Payer: Self-pay | Admitting: Emergency Medicine

## 2024-10-11 ENCOUNTER — Ambulatory Visit: Admission: EM | Admit: 2024-10-11 | Discharge: 2024-10-11 | Disposition: A | Discharge status: Home / Self Care

## 2024-10-11 DIAGNOSIS — B349 Viral infection, unspecified: Secondary | ICD-10-CM

## 2024-10-11 MED ORDER — IBUPROFEN 600 MG PO TABS
600.0000 mg | ORAL_TABLET | Freq: Three times a day (TID) | ORAL | 0 refills | Status: AC | PRN
Start: 2024-10-11 — End: ?

## 2024-10-11 MED ORDER — AZELASTINE HCL 0.1 % NA SOLN: 1.0000 | Freq: Two times a day (BID) | NASAL | 1 refills | Status: AC

## 2024-10-11 MED ORDER — PROMETHAZINE-DM 6.25-15 MG/5ML PO SYRP
10.0000 mL | ORAL_SOLUTION | Freq: Three times a day (TID) | ORAL | 0 refills | Status: AC | PRN
Start: 2024-10-11 — End: ?

## 2024-10-11 NOTE — ED Provider Notes (Signed)
 [facility]  Note:  This document was prepared using Conservation officer, historic buildings and may include unintentional dictation errors.  MRN: [id number] DOB: August 03, 2001  Subjective:   Deborah Holland is a 23 y.o. female presenting for sinus congestion, postnasal drainage, facial pressure pain, congestion, cough x 4 days.  Patient reports that she has taken some Tylenol  but no other over-the-counter medication to treat symptoms prior to arrival in urgent care.  Patient denies any known sick contacts.  No fever, body aches, fatigue, shortness of breath, chest pain.  Patient wondering if symptoms could be start of a sinus infection.  No current facility-administered medications for this encounter.  Current Outpatient Medications:    azelastine (ASTELIN) 0.1 % nasal spray, Place 1 spray into both nostrils 2 (two) times daily. Use in each nostril as directed, Disp: 30 mL, Rfl: 1   promethazine -dextromethorphan (PROMETHAZINE -DM) 6.25-15 MG/5ML syrup, Take 10 mLs by mouth 3 (three) times daily as needed., Disp: 240 mL, Rfl: 0   acetaminophen  (TYLENOL ) 500 MG tablet, Take 500 mg by mouth every 6 (six) hours as needed., Disp: , Rfl:    benzonatate  (TESSALON ) 100 MG capsule, Take 1 capsule (100 mg total) by mouth 3 (three) times daily as needed for cough., Disp: 21 capsule, Rfl: 0   docusate sodium  (COLACE) 100 MG capsule, Take 1 capsule (100 mg total) by mouth 2 (two) times daily as needed for mild constipation or moderate constipation. (Patient not taking: Reported on 12/29/2022), Disp: 30 capsule, Rfl: 2   ibuprofen  (ADVIL ) 600 MG tablet, Take 1 tablet (600 mg total) by mouth every 8 (eight) hours as needed (pain)., Disp: 15 tablet, Rfl: 0   Allergies  Allergen Reactions   Other     08/30/17 : Varicella vaccine: Reaction: eyes swelling per mother.    Past Medical History:  Diagnosis Date   Asthma    Chlamydia infection 08/18/2022     Past Surgical History:  Procedure Laterality  Date   LAPAROSCOPIC UNILATERAL SALPINGECTOMY Right 11/29/2022   Procedure: LAPAROSCOPIC RIGHT SALPINGECTOMY WITH REMOVAL OF ECTOPIC PREGNANCY;  Surgeon: Herchel Gloris LABOR, MD;  Location: [facility] OR;  Service: Gynecology;  Laterality: Right;    History reviewed. No pertinent family history.  Social History   Tobacco Use   Smoking status: Never   Smokeless tobacco: Never  Vaping Use   Vaping status: Never Used  Substance Use Topics   Alcohol use: Yes    Comment: Occassionally.   Drug use: Yes    Types: Marijuana    ROS Refer to HPI for ROS details.  Objective:   Vitals: BP (!) 123/58 (BP Location: Left Arm)   Pulse 69   Temp 98.2 F (36.8 C) (Oral)   Resp 18   Ht 5' 2 (1.575 m)   LMP 09/12/2024 (Exact Date)   SpO2 98%   Breastfeeding No   BMI 20.67 kg/m   Physical Exam Vitals and nursing note reviewed.  Constitutional:      General: She is not in acute distress.    Appearance: Normal appearance. She is well-developed. She is not ill-appearing or toxic-appearing.  HENT:     Head: Normocephalic and atraumatic.     Nose: Congestion and rhinorrhea present.     Mouth/Throat:     Mouth: Mucous membranes are moist.     Pharynx: Oropharynx is clear. No posterior oropharyngeal erythema.  Eyes:     Extraocular Movements: Extraocular movements intact.     Conjunctiva/sclera: Conjunctivae normal.  Cardiovascular:  Rate and Rhythm: Normal rate.  Pulmonary:     Effort: Pulmonary effort is normal. No respiratory distress.     Breath sounds: No stridor. No wheezing.  Skin:    General: Skin is warm and dry.  Neurological:     General: No focal deficit present.     Mental Status: She is alert and oriented to person, place, and time.  Psychiatric:        Mood and Affect: Mood normal.        Behavior: Behavior normal.     Procedures  No results found for this or any previous visit (from the past 24 hours).  No results found.   Assessment and Plan :      Discharge Instructions       1. Acute viral syndrome (Primary) - azelastine (ASTELIN) 0.1 % nasal spray; Place 1 spray into both nostrils 2 (two) times daily. Use in each nostril as directed  Dispense: 30 mL; Refill: 1 - promethazine -dextromethorphan (PROMETHAZINE -DM) 6.25-15 MG/5ML syrup; Take 10 mLs by mouth 3 (three) times daily as needed.  Dispense: 240 mL; Refill: 0 - ibuprofen  (ADVIL ) 600 MG tablet; Take 1 tablet (600 mg total) by mouth every 8 (eight) hours as needed (pain).  Dispense: 15 tablet; Refill: 0 -Continue to monitor symptoms for any change in severity if there is any escalation of current symptoms or development of new symptoms follow-up in ER for further evaluation and management.       Edel Rivero B Accalia Rigdon   Len Azeez, [place], [place] 10/11/24 [id number]

## 2024-10-11 NOTE — ED Triage Notes (Signed)
 Patient c/o possible sinus infection, nasal drainage, facial pain, congestion x 4 days.  Patient has taken Tylenol .

## 2024-10-11 NOTE — Discharge Instructions (Addendum)
  1. Acute viral syndrome (Primary) - azelastine (ASTELIN) 0.1 % nasal spray; Place 1 spray into both nostrils 2 (two) times daily. Use in each nostril as directed  Dispense: 30 mL; Refill: 1 - promethazine -dextromethorphan (PROMETHAZINE -DM) 6.25-15 MG/5ML syrup; Take 10 mLs by mouth 3 (three) times daily as needed.  Dispense: 240 mL; Refill: 0 - ibuprofen  (ADVIL ) 600 MG tablet; Take 1 tablet (600 mg total) by mouth every 8 (eight) hours as needed (pain).  Dispense: 15 tablet; Refill: 0 -Continue to monitor symptoms for any change in severity if there is any escalation of current symptoms or development of new symptoms follow-up in ER for further evaluation and management.
